# Patient Record
Sex: Female | Born: 1960 | Race: White | Hispanic: No | Smoking: Current every day smoker
Health system: Southern US, Academic
[De-identification: ages and names within clinical notes are randomized; demographics above are authoritative.]

## PROBLEM LIST (undated history)

## (undated) DIAGNOSIS — M1990 Unspecified osteoarthritis, unspecified site: Secondary | ICD-10-CM

## (undated) DIAGNOSIS — T4145XA Adverse effect of unspecified anesthetic, initial encounter: Secondary | ICD-10-CM

## (undated) DIAGNOSIS — R0602 Shortness of breath: Secondary | ICD-10-CM

## (undated) DIAGNOSIS — Q8502 Neurofibromatosis, type 2: Secondary | ICD-10-CM

## (undated) DIAGNOSIS — Q8500 Neurofibromatosis, unspecified: Secondary | ICD-10-CM

## (undated) DIAGNOSIS — R1310 Dysphagia, unspecified: Secondary | ICD-10-CM

## (undated) DIAGNOSIS — F32A Depression, unspecified: Secondary | ICD-10-CM

## (undated) DIAGNOSIS — F329 Major depressive disorder, single episode, unspecified: Secondary | ICD-10-CM

## (undated) DIAGNOSIS — E875 Hyperkalemia: Secondary | ICD-10-CM

## (undated) DIAGNOSIS — K219 Gastro-esophageal reflux disease without esophagitis: Secondary | ICD-10-CM

## (undated) DIAGNOSIS — F17210 Nicotine dependence, cigarettes, uncomplicated: Secondary | ICD-10-CM

## (undated) DIAGNOSIS — E785 Hyperlipidemia, unspecified: Secondary | ICD-10-CM

## (undated) DIAGNOSIS — T8859XA Other complications of anesthesia, initial encounter: Secondary | ICD-10-CM

## (undated) HISTORY — PX: HX EYE SURGERY: 2100001143

---
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
## 2009-08-09 HISTORY — PX: HX GALL BLADDER SURGERY/CHOLE: SHX55

## 2011-07-10 HISTORY — PX: HX HYSTERECTOMY: SHX81

## 2013-03-26 ENCOUNTER — Telehealth (INDEPENDENT_AMBULATORY_CARE_PROVIDER_SITE_OTHER): Payer: Self-pay

## 2013-03-26 ENCOUNTER — Ambulatory Visit
Admission: RE | Admit: 2013-03-26 | Discharge: 2013-03-26 | Disposition: A | Discharge status: Home / Self Care | Payer: MEDICAID | Source: Ambulatory Visit

## 2013-03-26 ENCOUNTER — Ambulatory Visit (INDEPENDENT_AMBULATORY_CARE_PROVIDER_SITE_OTHER): Payer: MEDICAID

## 2013-03-26 ENCOUNTER — Encounter (INDEPENDENT_AMBULATORY_CARE_PROVIDER_SITE_OTHER): Payer: Self-pay

## 2013-03-26 VITALS — BP 112/68 | HR 70 | Temp 97.0°F | Resp 16 | Ht 62.25 in | Wt 110.0 lb

## 2013-03-26 DIAGNOSIS — Z13220 Encounter for screening for lipoid disorders: Secondary | ICD-10-CM | POA: Insufficient documentation

## 2013-03-26 DIAGNOSIS — Z0000 Encounter for general adult medical examination without abnormal findings: Secondary | ICD-10-CM | POA: Insufficient documentation

## 2013-03-26 DIAGNOSIS — R5381 Other malaise: Secondary | ICD-10-CM | POA: Insufficient documentation

## 2013-03-26 DIAGNOSIS — F3289 Other specified depressive episodes: Secondary | ICD-10-CM | POA: Insufficient documentation

## 2013-03-26 DIAGNOSIS — Q8502 Neurofibromatosis, type 2: Secondary | ICD-10-CM | POA: Insufficient documentation

## 2013-03-26 LAB — LIPID PANEL
CHOL/HDL RATIO: 3.4
CHOLESTEROL: 239 mg/dL — ABNORMAL HIGH (ref 120–199)
HDL-CHOLESTEROL: 71 mg/dL — ABNORMAL HIGH (ref 45–65)
LDL (CALCULATED): 143 mg/dL — ABNORMAL HIGH (ref <130)
TRIGLYCERIDES: 125 mg/dL (ref 35–135)
VLDL (CALCULATED): 25 mg/dL

## 2013-03-26 LAB — COMPREHENSIVE METABOLIC PROFILE - BMC/JMC ONLY
ALBUMIN/GLOBULIN RATIO: 1.6
ALBUMIN: 4.4 g/dL (ref 3.5–5.0)
ALKALINE PHOSPHATASE: 88 IU/L (ref 38–126)
ALT (SGPT): 15 IU/L (ref 14–54)
ANION GAP: 6 mmol/L (ref 3–11)
AST (SGOT): 22 IU/L (ref 15–41)
BILIRUBIN, TOTAL: 0.6 mg/dL (ref 0.3–1.2)
BUN: 11 mg/dL (ref 6–20)
CALCIUM: 9.6 mg/dL (ref 8.6–10.3)
CARBON DIOXIDE: 29 mmol/L (ref 22–32)
CHLORIDE: 107 mmol/L (ref 101–111)
CREATININE: 0.62 mg/dL (ref 0.44–1.00)
ESTIMATED GLOMERULAR FILTRATION RATE: >60 mL/min (ref >60)
GLUCOSE: 91 mg/dL (ref 70–110)
POTASSIUM: 4.9 mmol/L (ref 3.4–5.1)
SODIUM: 142 mmol/L (ref 136–145)
TOTAL PROTEIN: 7.1 g/dL (ref 6.4–8.3)

## 2013-03-26 LAB — CBC
BASOPHIL #: 0.1 10*3/uL (ref 0.0–0.10)
BASOPHILS %: 1.6 % (ref 0–2.50)
EOSINOPHIL #: 0.2 10*3/uL (ref 0.00–0.50)
EOSINOPHIL %: 3.1 % (ref 0.0–5.2)
HCT: 45.3 % (ref 36.0–48.0)
HGB: 15.5 g/dL (ref 12.0–16.0)
LYMPHOCYTE #: 1.6 K/uL (ref 0.7–3.20)
LYMPHOCYTE %: 30.8 % (ref 15.0–43.0)
MCH: 32 pg (ref 28.3–34.3)
MCHC: 34.1 g/dL (ref 32.0–36.0)
MCV: 93.9 fL (ref 82.0–100.0)
MONOCYTE #: 0.6 10*3/uL (ref 0.20–0.90)
MONOCYTE %: 10.8 % (ref 4.8–12.0)
MPV: 8.2 fL (ref 7.4–10.45)
NRBC ABSOLUTE: 0 10*3/uL (ref 0–0.02)
NRBC: 0.1 /100 WBC (ref 0–0.3)
PLATELET COUNT: 228 10*3/uL (ref 150–400)
PMN #: 2.8 10*3/uL (ref 1.5–6.5)
PMN %: 53.7 % (ref 43.0–76.0)
RBC: 4.83 M/uL (ref 4.0–5.6)
RDW: 13.5 % (ref 11.0–16.0)
WBC: 5.1 10*3/uL (ref 4.0–11.0)

## 2013-03-26 MED ORDER — CELECOXIB 200 MG CAPSULE
200.00 mg | ORAL_CAPSULE | Freq: Two times a day (BID) | ORAL | Status: DC
Start: 2013-03-26 — End: 2013-03-28

## 2013-03-26 MED ORDER — RANITIDINE 150 MG TABLET
150.00 mg | ORAL_TABLET | Freq: Two times a day (BID) | ORAL | Status: DC
Start: 2013-03-26 — End: 2014-01-12

## 2013-03-26 MED ORDER — ALBUTEROL SULFATE HFA 90 MCG/ACTUATION AEROSOL INHALER
1.0000 | INHALATION_SPRAY | Freq: Four times a day (QID) | RESPIRATORY_TRACT | Status: DC | PRN
Start: 2013-03-26 — End: 2013-08-13

## 2013-03-26 NOTE — H&P (Signed)
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  Mammogram - last 2-3 yrs ago.  Could not afford the prep for a c-scope but did have one ordered.    SURG HX - total hysterectomy (except cervix - which she still has), laparoscopic GB, left blepharoplasty for ptosis, left ureter surgery age 52.    SOC HX - smoker 1ppd (started age 33), occasional EtOH,  Works at [employer], lives in [place] now, 3 children (son lives in area, other 2 are in [place]). Separated from spouse.  At least 1 pot of coffee per day (more in winter), cocacola 20-40oz per day (had been drinking 12 pack per day), drinking 2-3 glasses of water (and lots of vitamin water).     FAM HX - Neurofibromatosis - told father's great aunt also had it (no 1st degree relatives), mAunt with b/l breast CA, brother with Parkinson's (around age 83), sister with hypothyroidism, sister with polio, father with esophageal CA, father with MI (late 19's), mother with severe HTN.    GEN - daytime fatigue (pt snores), hot flashes & night sweats. Otherwise  HEENT - some sinus congestion, + chronic HOH, chronic vision issues, otherwise negative  CV - negative  LUNGS - + DOE/SOB, smokers cough (no hemoptysis), no wheezing  GI - + reflux occ worse with fried foods, otherwise negative  GYN - post-menopausal, otherwise negative   PSYCH - depression (sometimes just wants to cry for nothing, and emotionally labile with anger), does feel worse since she has been off Paxil.  NEURO - negative seizures or syncope, occ feet numbing with legs crossed.  SKIN - itching right anterior shin, multiple nodules from neurofibromatosis  HEME - easy bruising for past 1 yr, no problems with bleeding really.    O: VSS. AF.   GEN- A&O, in NAD   HEENT - NCAT. TMs benign b/l. Sclera nonicteric. PERRLA. EOMI. Nares benign b/l. OP benign. No neck masses or lymphadenopathy.   CV - RRR, s1 and s2, no murmurs   LUNGS - CTAB no w/r/r   ABD - +BSx4, NTTP, no palpable organomegaly.   MSK- ROM intact, no gross abnormalities.   NEURO - CN2-12 intact b/l, Patellar DTR +2 b/l.   BREAST - deferred until next visit  PELVIC - deferred until next visit  PSYCH -appropriate mood and affect, thoughts logical and goal oriented.  SKIN- soft, fleshy nodules noted covering entire body, ranging in size from 5mm to 3cm.  What appears to be lipoma underlying one nodule right upper back.    A/P: NEUROFIBROMATOSIS - Referral to neurology. C-scope as below.  COPD - Albuterol prn.  FATIGUE - CBC, TSH, referral for sleep study  GERD - Zantac  DEPRESSION- Paxil, Vit D level  OA - Celebrex not covered by [facility]. Will Rx Meloxicam 7.5 / d instead.   HM - Referral for c-scope, mammo ordered. F/u in 2 weeks  for pelvic and breast exam.

## 2013-03-26 NOTE — Progress Notes (Signed)
BP 112/68   Pulse 70   Temp(Src) 36.1 C (97 F) (Oral)   Resp 16   Ht 1.581 m (5' 2.25")   Wt 49.896 kg (110 lb)   BMI 19.96 kg/m2

## 2013-03-26 NOTE — Patient Instructions (Signed)
-   Have labs drawn today.  - Referral for Mammogram - call [phone] to schedule [facility]).  - Referral for Neurology -   - Referral for Colonoscopy -   - Sleep Study ordered  - Restart Paxil 20 mg daily.  - Celebrex for arthritis  - Zantac twice daily for reflux  - Follow up with Dr. Berna Spare in 2-4 weeks - for lab results review and breast & pelvic exam.

## 2013-03-26 NOTE — Telephone Encounter (Signed)
Celebrex not preferred with medicaid. Please change to preferred alternative.  meloxicam

## 2013-03-28 LAB — VITAMIN D, SERUM (25 HYDROXYVITAMIN D2 AND D3 BY MS)
25 HYDROXYVITAMIN D2/D3,total: 32.5 ng/mL
25 HYDROXYVITAMIN D3: 32.5 ng/mL

## 2013-03-28 MED ORDER — MELOXICAM 7.5 MG TABLET
7.5000 mg | ORAL_TABLET | Freq: Every day | ORAL | Status: DC
Start: 2013-03-28 — End: 2013-06-25

## 2013-04-01 ENCOUNTER — Ambulatory Visit
Admission: RE | Admit: 2013-04-01 | Discharge: 2013-04-01 | Disposition: A | Discharge status: Home / Self Care | Payer: MEDICAID | Source: Ambulatory Visit

## 2013-04-01 DIAGNOSIS — R5381 Other malaise: Secondary | ICD-10-CM | POA: Insufficient documentation

## 2013-04-02 ENCOUNTER — Encounter (INDEPENDENT_AMBULATORY_CARE_PROVIDER_SITE_OTHER): Payer: Self-pay | Admitting: Surgery

## 2013-04-02 ENCOUNTER — Ambulatory Visit (INDEPENDENT_AMBULATORY_CARE_PROVIDER_SITE_OTHER): Payer: MEDICAID | Admitting: Surgery

## 2013-04-02 VITALS — BP 127/68 | HR 70 | Temp 96.8°F | Ht 62.25 in | Wt 106.8 lb

## 2013-04-02 NOTE — Progress Notes (Signed)
Medicaid does not require authorization for colonoscopy scheduled for 04/18/13.

## 2013-04-02 NOTE — Progress Notes (Signed)
See dictated note.    #[id number]

## 2013-04-03 NOTE — H&P (Signed)
 [facility]                                       HISTORY AND PHYSICAL    PATIENT NAME: [name], [name]  HOSPITAL NUMBER:[id number]  DATE OF SERVICE:04/02/2013  DATE OF BIRTH: 08-Jun-1961    The patient is being seen in consultation at the request of Dr. Arch for colon cancer screening.    CHIEF COMPLAINT:  Colon cancer screening.    HISTORY OF PRESENT ILLNESS:  The patient is a 52 year old female with a history of neurofibromatosis who presents for initial colon cancer screening.  She is unaware of any family history of colon cancer.  She further denies any abdominal pain, blood per rectum, change in bowel habits or unexplained weight loss.  The patient has had no prior colonoscopy.    ALLERGIES:  CODEINE.    MEDICATIONS:  1.  Proventil  inhaler 1-2 puffs every 6 hours as needed.  2.  Mobic  7.5 milligrams daily.  3.  Paxil  20 milligrams every morning.  4.  Zantac  150 milligrams twice a day.    PAST MEDICAL HISTORY:  Gastroesophageal reflux disease, neurofibromatosis, hiatal hernia, chronic obstructive pulmonary disease and arthritis.    PAST SURGICAL HISTORY:  Laparoscopic cholecystectomy in 2011, hysterectomy with cervical exclusion in 2013, upper endoscopy.      SOCIAL HISTORY:  Smokes cigarettes, 1 pack a day.  She states she is trying to stop.  She denies alcohol use.    FAMILY HISTORY:  Esophageal cancer, diabetes and hypertension.    REVIEW OF SYSTEMS   Constitutional:  Weight loss.    HEENT:  Headaches, wheezing.  Eyes:  Negative.  Cardiovascular:  Difficulty breathing while sleeping.    Respiratory:  Chronic cough, shortness of breath, wheezing.    Gastrointestinal:  Heartburn.  Genitourinary:  Negative.    Musculoskeletal:  Neck pain, back pain, joint pain.    Hematologic:  Negative.  Immunologic:  Negative.  Lymphatic:  Negative.    Breasts:  Negative.    Endocrine:  Negative.   Psychiatric:  Depression and anxiety.    PHYSICAL EXAMINATION:  Vital signs show  blood pressure 127/68, pulse 80, temperature 96.8 degrees Fahrenheit, weight 106.2 pounds, height 62.25 inches.  The patient is alert and oriented, in no acute distress, a well-developed, well-hydrated female who has multiple, soft, pedunculated tumors of various sizes throughout her body, extremities, face.   HEENT:  Head is atraumatic.  Sclerae are anicteric.  Mouth is moist, slight erythema in the upper palate at the interface of her dentures.  NECK:  Supple, no nodes.  Trachea is midline.  LUNGS:  Bilaterally clear to auscultation.  BACK:  No costovertebral angle tenderness.  HEART:  S1 and S2, regular rate and rhythm.  ABDOMEN:  Nondistended, soft, normoactive bowel sounds.  Epigastric tenderness.    EXTREMITIES:  The patient moves all 4 extremities.  Radial pulses are 3+.  Grip strength 5/5.  No lower extremity edema.  NEUROLOGIC:  No focal neurological deficits.  Cranial nerves grossly intact.    ASSESSMENT:  A 52 year old female with neurofibromatosis, for colon cancer screening.    PLAN:  Colonoscopy.    The risks of the procedure were discussed.  Discussion was kept in layman's terms.  The patient's questions were answered and she voiced understanding and agreed to proceed.  Preop workup will otherwise be routine.  She will undergo Dulcolax, MiraLax, Gatorade  bowel prep.      Darryle Lesches, MD      TY/xf/[id number]; D: 04/02/2013 13:49:04 T: 04/02/2013 14:24:10

## 2013-04-10 NOTE — Procedures (Signed)
[facility]                                 [place], [place] [address]                                         SLEEP STUDY      PATIENT NAME:   [name], [name]   MEDICAL RECORD [id number]  DATE OF BIRTH:  [birth year]    SERVICE DATE:   04/01/2013         PRIMARY CARE PHYSICIAN:  Dr. Gwenith Daily.    INDICATIONS FOR TESTING:  Fatigue, witnessed snoring, difficulty initiating and maintaining sleep.    DESCRIPTION OF PROCEDURE:  The patient is a 52 year old lady studied at the sleep disorder and diagnostic center in [place], [place], on April 01, 2013, for 415 minutes.  She had sleep onset of 12 1/2 minutes and a sleep efficiency of 90%.  Sleep architecture reveals 3.5 minutes in stage 1, 291.5 minutes in stage 2, 55 minutes in stage 3,16.5 minutes in REM, and 49 minutes awake.  The patient had rare episodes of sleep-disordered breathing over the course of the night, 13 episodes of hypopnea with an apnea-hypopnea index equal to 3.  The patient had brief oxygen desaturation as low as 86%.  There was 3 1/2 minutes with saturations below 89 total.  The patient had frequent periodic leg movements.  The leg movement index was 35.  58 of the leg movements resulted in arousals with an arousal index of 9.5.    IMPRESSION:  1.  No evidence of sleep-disordered breathing.  2.  Periodic leg movements of sleep.    RECOMMENDATION:  CPAP is not indicated at this time.      Cristal Generous, MD      HQ/IO/[id number]; D: 04/10/2013 11:54:29; T: 04/10/2013 13:23:32    cc: Milda Smart DO      [address]       [place], [place] [address]

## 2013-04-13 ENCOUNTER — Encounter (INDEPENDENT_AMBULATORY_CARE_PROVIDER_SITE_OTHER): Payer: Self-pay

## 2013-04-13 ENCOUNTER — Ambulatory Visit
Admission: RE | Admit: 2013-04-13 | Discharge: 2013-04-13 | Disposition: A | Discharge status: Home / Self Care | Payer: MEDICAID | Source: Ambulatory Visit | Attending: Surgery | Admitting: Surgery

## 2013-04-13 HISTORY — DX: Depression, unspecified: F32.A

## 2013-04-13 HISTORY — DX: Other complications of anesthesia, initial encounter: T88.59XA

## 2013-04-13 HISTORY — DX: Shortness of breath: R06.02

## 2013-04-13 NOTE — Nurses Notes (Signed)
Preoperative telephone interview completed at this time. Patient verbalizes understanding of preoperative instructions. Day of surgery instructions given. All questions answered. Patient instructed to take Inhaler the morning of surgery.

## 2013-04-13 NOTE — Progress Notes (Signed)
Sleep Study results scanned.    Recommendation:  CPAP is not indicated at this time.

## 2013-04-18 ENCOUNTER — Inpatient Hospital Stay
Admission: RE | Admit: 2013-04-18 | Discharge: 2013-04-18 | Disposition: A | Discharge status: Home / Self Care | Payer: MEDICAID | Source: Ambulatory Visit | Attending: Surgery | Admitting: Surgery

## 2013-04-18 ENCOUNTER — Encounter
Admission: RE | Disposition: A | Discharge status: Home / Self Care | Payer: Self-pay | Source: Ambulatory Visit | Attending: Surgery

## 2013-04-18 DIAGNOSIS — Z800 Family history of malignant neoplasm of digestive organs: Secondary | ICD-10-CM | POA: Insufficient documentation

## 2013-04-18 DIAGNOSIS — Z833 Family history of diabetes mellitus: Secondary | ICD-10-CM | POA: Insufficient documentation

## 2013-04-18 DIAGNOSIS — Q8500 Neurofibromatosis, unspecified: Secondary | ICD-10-CM | POA: Insufficient documentation

## 2013-04-18 DIAGNOSIS — K219 Gastro-esophageal reflux disease without esophagitis: Secondary | ICD-10-CM | POA: Insufficient documentation

## 2013-04-18 DIAGNOSIS — J4489 Other specified chronic obstructive pulmonary disease: Secondary | ICD-10-CM | POA: Insufficient documentation

## 2013-04-18 DIAGNOSIS — M129 Arthropathy, unspecified: Secondary | ICD-10-CM | POA: Insufficient documentation

## 2013-04-18 DIAGNOSIS — Z8249 Family history of ischemic heart disease and other diseases of the circulatory system: Secondary | ICD-10-CM | POA: Insufficient documentation

## 2013-04-18 DIAGNOSIS — Z1211 Encounter for screening for malignant neoplasm of colon: Secondary | ICD-10-CM | POA: Insufficient documentation

## 2013-04-18 DIAGNOSIS — F17200 Nicotine dependence, unspecified, uncomplicated: Secondary | ICD-10-CM | POA: Insufficient documentation

## 2013-04-18 DIAGNOSIS — K449 Diaphragmatic hernia without obstruction or gangrene: Secondary | ICD-10-CM | POA: Insufficient documentation

## 2013-04-18 DIAGNOSIS — F3289 Other specified depressive episodes: Secondary | ICD-10-CM | POA: Insufficient documentation

## 2013-04-18 HISTORY — PX: PB COLONOSCOPY,DIAGNOSTIC: 45378

## 2013-04-18 HISTORY — DX: Neurofibromatosis, unspecified (CMS HCC): Q85.00

## 2013-04-18 SURGERY — COLONOSCOPY
Anesthesia: General | Site: Rectum | Wound class: Clean Contaminated Wounds-The respiratory, GI, Genital, or urinary

## 2013-04-18 MED ORDER — LIDOCAINE HCL 2 % MUCOSAL JELLY
5.0000 mL | Freq: Once | Status: AC
Start: 2013-04-18 — End: 2013-04-18
  Administered 2013-04-18: 5 mL via TOPICAL

## 2013-04-18 SURGICAL SUPPLY — 1 items: GOWN SURG XL XLNG L4 RGLN SLEEVE BRTHBL A LINE STRL LF  DISP SMARTGOWN (DGOW) ×1 IMPLANT

## 2013-04-18 NOTE — Consults (Signed)
I certify that I was present for induction, emergence and critical phases of this case and that I was at all times immediately available for consultation and management as necessary. Care was provided by credentialed providers and I reviewed and concurred with the anesthesia plan.

## 2013-04-18 NOTE — Consults (Signed)
[facility]  ANESTHESIA POSTOP EVALUATION NOTE    Alexa, Simon, 52 y.o. female  Date of Birth:  August 01, 1961    04/18/2013    Temperature: 36.4 C (97.5 F) (04/18/13 1215)  Heart Rate: 86 (04/18/13 1215)  BP (Non-Invasive): 121/86 mmHg (04/18/13 1215)  Respiratory Rate: 15 (04/18/13 1215)  SpO2-1: 96 % (04/18/13 1215)  Pain Score (Numeric, Faces): 0 (04/18/13 1215)    I have reviewed and evaluated the following:  Respiratory Function: Regular and unlabored breathing, consistent with pre-anesthetic level  Cardiovascular Function:  Vital signs are stable, consistent with pre anesthetic level  Mental Status: Awake and alert  Pain:  None  Nausea and Vomiting:  None      Post operative complications: None          Burna Forts, MD 04/18/2013, 12:44 PM

## 2013-04-18 NOTE — OR Surgeon (Signed)
[facility]                                           BRIEF OPERATIVE NOTE    Patient Name: [facility] Number: [id number]  Date of Service: 04/18/2013   Date of Birth: September 18, 1960    Chief Complaint: Colon cancer screening    Pre-Operative Diagnosis: Colon cancer screening [V76.51]    Post-Operative Diagnosis: normal colon    Procedure(s)/Description:  Procedure(s):  COLONOSCOPY    Findings: Normal colon    Attending Surgeon: Lowella Dandy, MD    Anesthesia:  CRNA: Roseanne Kaufman, CRNA    Estimated Blood Loss:  Minimal    Specimens Removed:  None                Complications:   None    Patient is at increased risk for surgical bleeding:  No    Patient is on postop antibiotic regimen greater than 24 hours:  No           Disposition: PACU - hemodynamically stable.       Lowella Dandy, MD 04/18/2013 12:04 PM

## 2013-04-18 NOTE — OR Surgeon (Signed)
[facility]                                  [place], [place] [address]                                                                OPERATIVE SUMMARY    PATIENT NAME: [name], [name]  HOSPITAL NUMBER:[id number]  DATE OF SERVICE:04/18/2013  DATE OF BIRTH: 12/13/1960    PREOPERATIVE DIAGNOSIS:  Colon cancer screening.    POSTOPERATIVE DIAGNOSIS:  Normal colon.    NAME OF PROCEDURE:  Colonoscopy.    SURGEON:  Dr. Westly Pam    ANESTHESIA:  Monitored anesthesia care.    INDICATIONS FOR PROCEDURE:  The patient is a 52 year old female who presents for colon cancer screening.    FINDINGS:  Grossly normal-appearing colonic mucosa.    TECHNICAL DESCRIPTION OF PROCEDURE:  The patient was brought to the endoscopy suite, placed in the left lateral decubitus position.  After initiation of IV sedation and completion of a timeout, a digital rectal exam was performed.  Consultation was inserted and with a moderate degree of difficulty, it was able to be advance to the cecum.  This required application of abdominal wall pressure.  Cecal position was confirmed with visualization of the ileocecal valve and the appendiceal orifice.  Further evaluation of the colon was performed and the scope was retrieved.  At 10-cm scope was retroflexed for J-view.  The scope was then straightened.  Air and fluid were suctioned from the colon and the scope was removed.  The patient tolerated the procedure well.  There were no complications.    ESTIMATED BLOOD LOSS:  None.    FLUIDS:  As per anesthesia records.    SPECIMENS:  None.    The patient was transferred awake and hemodynamically stable to the recovery room area.  All counts were correct.      Westly Pam, MD      UE/AV/[id number]; D: 04/18/2013 12:42:58; T: 04/18/2013 13:06:29

## 2013-04-18 NOTE — Consults (Signed)
 [facility]  ANESTHESIA PRE-OP EVALUATION    MRN:  [id number]  [name]  52 y.o.  Sex: female     Date of Service: 04/18/2013    Surgeon: Clotilde):  Arloa Darryle CROME, MD    Scheduled Procedure:  Procedure(s):  COLONOSCOPY    Diagnosis/Pertinent HPI: Colon cancer screening [V76.51]     Weight: 48.081 kg (106 lb)  Height: 157.5 cm (5' 2.01)  BP (Non-Invasive): 129/75 mmHg  Heart Rate: 73  Respiratory Rate: 16  Temperature: 36.5 C (97.7 F)  SpO2-1: 99 %    ALLERGY:    Allergies   Allergen Reactions   . Codeine Hives/ Urticaria       Medications Prior to Admission    Outpatient Medications    albuterol  sulfate (PROVENTIL  OR VENTOLIN ) 90 mcg/actuation Inhalation HFA Aerosol Inhaler    Take 1-2 Puffs by inhalation Every 6 hours as needed    cholecalciferol, vitamin D3, 1,000 unit Oral Tablet    Take 1,000 Units by mouth Once a day    meloxicam  (MOBIC ) 7.5 mg Oral Tablet    Take 1 Tab (7.5 mg total) by mouth Once a day    PARoxetine  (PAXIL ) 20 mg Oral Tablet    Take 1 Tab (20 mg total) by mouth Every morning    ranitidine  (ZANTAC ) 150 mg Oral Tablet    Take 1 Tab (150 mg total) by mouth Twice daily          Current Facility-Administered Medications:  LR premix infusion  Intravenous Continuous   NS flush syringe 2.5 mL Intravenous Q15 Min PRN       Past Medical History   Diagnosis Date   . Chronic obstructive pulmonary disease    . Anesthesia complication      O2 level dropped   . Shortness of breath    . Depression    . Neurofibromatosis        Pregnant: N/A Hysterectomy    Past Surgical History   Procedure Laterality Date   . Hx gall bladder surgery/chole  2011   . Hx hysterectomy  07/2011   . Hx other  1971     replaced ureter to left kidney   . Hx eye surgery       Eye lid reduction       Prior Anesthesia Difficulties:  no    Familial Anesthesia Difficulties: no    History   Substance Use Topics   . Smoking status: Current Every Day Smoker -- 1.00 packs/day for 35 years    Types: Cigarettes   . Smokeless tobacco: Never Used   . Alcohol Use: No       Labs:   BMP:    Lab Results   Component Value Date    SODIUM 142 03/26/2013    POTASSIUM 4.9 03/26/2013    CHLORIDE 107 03/26/2013    CO2 29 03/26/2013    BUN 11 03/26/2013    CREATININE 0.62 03/26/2013    GLUCOSECJ 91 03/26/2013    ANIONGAP 6 03/26/2013    GFR >60 03/26/2013    CALCIUM 9.6 03/26/2013     CBC:    Lab Results   Component Value Date    WBCJ 5.1 03/26/2013    HGB 15.5 03/26/2013    HCT 45.3 03/26/2013      Platelets:    Lab Results   Component Value Date    PLTCNT 228 03/26/2013     Coags:    No results found  for this basename: prothromtme, inr, aptt, ddimerquant, fibrinogen     TSH:    No results found for this basename: TSH          EKG: None    CXR:  None    Sleep study negative      ANESTHESIA DAY OF SURGERY EVALUATION      NPO: > 8 hrs  Airway: II (soft palate, uvula, fauces visible)  Dentition:  upper and lower dentures    Cardiovascular: regular rate and rhythm   Respiratory: Clear to auscultation bilaterally.        ASA Status: 2  Proposed Anesthesia: IV General.Used inhalers today      Pre-Induction Evaluation and informed consent including risks, benefits, and alternatives. Patient was seen and evaluated immediately prior to the induction of anesthesia.        Rosan KATHEE Hood, MD 04/18/2013, 11:11 AM

## 2013-04-18 NOTE — H&P (Signed)
 [facility]                                                                                       H&P Update Form    [name], [name], 52 y.o. female  Date of Admission:  04/18/2013  Date of Birth:  1960/11/25    04/18/2013    STOP: IF H&P IS GREATER THAN 30 DAYS FROM SURGICAL DAY COMPLETE NEW H&P IS REQUIRED.    Outpatient Pre-Surgical H & P updated the day of the procedure.  1.  H&P completed within 30 days of surgical procedure was performed by Dr. Arloa on 04/02/2013 and has been reviewed, the patient has been examined, and no change has occured in the patients condition since the H&P was completed.       Change in medications: No      Last Menstrual Period: s/p hysterectomy      Comments: A 52 year old female with neurofibromatosis, for colon cancer screening.      2.  Patient continues to be appropiate candidate for planned surgical procedure. YES      Darryle LITTIE Arloa, MD

## 2013-04-18 NOTE — Nurses Notes (Signed)
Patient discharged home with family.  AVS reviewed with patient/care giver.  A written copy of the AVS and discharge instructions was given to the patient/care giver.  Questions sufficiently answered as needed.  Patient/care giver encouraged to follow up with PCP as indicated.  In the event of an emergency, patient/care giver instructed to call 911 or go to the nearest emergency room.

## 2013-04-23 ENCOUNTER — Encounter (INDEPENDENT_AMBULATORY_CARE_PROVIDER_SITE_OTHER): Payer: MEDICAID

## 2013-04-23 ENCOUNTER — Ambulatory Visit
Admission: RE | Admit: 2013-04-23 | Discharge: 2013-04-23 | Disposition: A | Discharge status: Home / Self Care | Payer: MEDICAID | Source: Ambulatory Visit

## 2013-04-23 DIAGNOSIS — Z1231 Encounter for screening mammogram for malignant neoplasm of breast: Secondary | ICD-10-CM | POA: Insufficient documentation

## 2013-05-02 ENCOUNTER — Other Ambulatory Visit
Admission: RE | Admit: 2013-05-02 | Discharge: 2013-05-02 | Disposition: A | Discharge status: Home / Self Care | Payer: MEDICAID

## 2013-05-02 ENCOUNTER — Ambulatory Visit (INDEPENDENT_AMBULATORY_CARE_PROVIDER_SITE_OTHER): Payer: MEDICAID

## 2013-05-02 ENCOUNTER — Other Ambulatory Visit: Payer: Self-pay

## 2013-05-02 ENCOUNTER — Encounter (INDEPENDENT_AMBULATORY_CARE_PROVIDER_SITE_OTHER): Payer: Self-pay

## 2013-05-02 VITALS — BP 102/58 | HR 62 | Temp 97.4°F | Resp 14 | Ht 62.0 in | Wt 104.0 lb

## 2013-05-02 DIAGNOSIS — Z124 Encounter for screening for malignant neoplasm of cervix: Secondary | ICD-10-CM | POA: Insufficient documentation

## 2013-05-02 DIAGNOSIS — Z1151 Encounter for screening for human papillomavirus (HPV): Secondary | ICD-10-CM | POA: Insufficient documentation
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
## 2013-05-02 NOTE — Patient Instructions (Addendum)
 [place]  [facility]  [phone]  [email or web address]    Advance Directive and Other Forms Downloadable for Personal Use from the website:  [facility] e-Directive Registry signup form   FAQ: Health Care Decision Making in   (includes forms)   [place] Living Will Form   [place] Living Will Form Insurance risk surveyor PDF)   [place] Medical Power of Attorney Form   [facility] Medical Power of Attorney Form (editable PDF)   [place] [facility] Living Will and Medical Power of Attorney Form   [place]  [facility] Living Will and Medical Power of Attorney Form (editable PDF)   [place] Checklist for Surrogate Selection   Hospice Services in a Nursing Home Facility Brochure Restless Legs Syndrome  Restless legs syndrome is a movement disorder. It may also be called a sensori-motor disorder.   CAUSES   No one knows what specifically causes restless legs syndrome, but it tends to run in families. It is also more common in people with low iron, in pregnancy, in people who need dialysis, and those with nerve damage (neuropathy).Some medications may make restless legs syndrome worse.Those medications include drugs to treat high blood pressure, some heart conditions, nausea, colds, allergies, and depression.  SYMPTOMS  Symptoms include uncomfortable sensations in the legs. These leg sensations are worse during periods of inactivity or rest. They are also worse while sitting or lying down. Individuals that have the disorder describe sensations in the legs that feel like:   Pulling.   Drawing.   Crawling.   Worming.   Boring.   Tingling.   Pins and needles.   Prickling.   Pain.  The sensations are usually accompanied by an overwhelming urge to move the legs. Sudden muscle jerks may also occur. Movement provides temporary relief from the discomfort. In rare cases, the arms may also be affected. Symptoms may interfere with going to sleep (sleep onset insomnia). Restless legs syndrome may also be related to periodic limb movement disorder  (PLMD). PLMD is another more common motor disorder. It also causes interrupted sleep. The symptoms from PLMD usually occur most often when you are awake.  TREATMENT   Treatment for restless legs syndrome is symptomatic. This means that the symptoms are treated.    Massage and cold compresses may provide temporary relief.   Walk, stretch, or take a cold or hot bath.   Get regular exercise and a good night's sleep.   Avoid caffeine, alcohol, nicotine , and medications that can make it worse.   Do activities that provide mental stimulation like discussions, needlework, and video games. These may be helpful if you are not able to walk or stretch.  Some medications are effective in relieving the symptoms. However, many of these medications have side effects. Ask your caregiver about medications that may help your symptoms. Correcting iron deficiency may improve symptoms for some patients.  Document Released: 07/16/2002 Document Revised: 10/18/2011 Document Reviewed: 10/22/2010  [facility] Patient Information 2014 [place], [place].      SCALENE (NECK) MUSCLE TENSION            The scalene muscles connect from the upper and middle neck bones to the ribs.  When they contract, they move the head from side to side (ear towards shoulder). They also help to stabilize the neck from jarring or whiplash.   The scalenes can easily get strained from chronically holding the shoulders up too high (like often happens with typing on a computer or gripping a steering wheel while driving for long periods of time)  or with chronic stress/anxiety and poor posture.    The brachial plexus contains all of the nerves that travel to the arm and hand - it passes just between the scalene muscles.  So spasm of the scalenes can lead to nerve pain in the arm as the nerves get pinched between the muscles.    TREATMENT   Scalene muscle tension is best treated with osteopathic manipulation, heat (such as a rice sock applied for 20 min several times  per day) and neck stretches performed at least twice daily.   Also work on improving your posture - sitting up straight and keeping your head balanced on top of your spine. Focus on relaxing your shoulders and neck, even when you are stressed.   Avoid carrying heavy bags only on one shoulder which can stress one side of your neck more than the other.   Sometimes muscle relaxant medications are prescribed, and tylenol or ibuprofen can be helpful for relieving acute pain.  The best and most effective treatment is regular stretching though.           1) Ear towards shoulder  2) Ear towards breast pocket  3) Eyes towards breast pocket    Hold each for 30-60 seconds.  Perform all three on both sides.  Add weight of opposite hand on top of head for additional stretch.

## 2013-05-04 LAB — HISTORICAL CYTOPATHOLOGY-GYN (PAP AND HPV TESTS): Final Diagnosis: NEGATIVE

## 2013-05-07 ENCOUNTER — Other Ambulatory Visit (HOSPITAL_BASED_OUTPATIENT_CLINIC_OR_DEPARTMENT_OTHER): Payer: Self-pay | Admitting: Neurology

## 2013-05-07 LAB — HPV HIGH RISK REFLEX TO GENOTYPE - BMC/JMC ONLY: HPV, RNA: NEGATIVE

## 2013-05-11 ENCOUNTER — Ambulatory Visit (HOSPITAL_BASED_OUTPATIENT_CLINIC_OR_DEPARTMENT_OTHER)
Admission: RE | Admit: 2013-05-11 | Discharge: 2013-05-11 | Disposition: A | Discharge status: Home / Self Care | Payer: MEDICAID | Source: Ambulatory Visit | Attending: Neurology | Admitting: Neurology

## 2013-05-11 DIAGNOSIS — Q8500 Neurofibromatosis, unspecified: Secondary | ICD-10-CM | POA: Insufficient documentation

## 2013-05-11 MED ADMIN — gadobutroL 7.5 mmol/7.5 mL (1 mmol/mL) intravenous solution: 5 mL | INTRAVENOUS | NDC 50419032511

## 2013-05-22 ENCOUNTER — Encounter (INDEPENDENT_AMBULATORY_CARE_PROVIDER_SITE_OTHER): Payer: Self-pay

## 2013-06-07 ENCOUNTER — Telehealth (INDEPENDENT_AMBULATORY_CARE_PROVIDER_SITE_OTHER): Payer: Self-pay

## 2013-06-07 NOTE — Telephone Encounter (Signed)
Pt called concerning the Mobic, she states she is not feeling relief and is asking for a different medication. She said she gave it 3 months to make sure it was in her system and it just isn't helping. Please advise.    [facility]  Registration Specialist

## 2013-06-18 NOTE — Telephone Encounter (Signed)
Pt called checking status.

## 2013-06-25 ENCOUNTER — Other Ambulatory Visit (INDEPENDENT_AMBULATORY_CARE_PROVIDER_SITE_OTHER): Payer: Self-pay

## 2013-06-25 MED ORDER — MELOXICAM 15 MG TABLET
15.0000 mg | ORAL_TABLET | Freq: Every day | ORAL | Status: DC
Start: 2013-06-25 — End: 2013-07-13

## 2013-06-25 NOTE — Telephone Encounter (Signed)
Patient will try 15 mg daily.  Appointment scheduled for 12/10 for follow up.  Refill entered.  Please authorize.

## 2013-06-25 NOTE — Telephone Encounter (Signed)
 Left message for patient to return call.  Per Dr. Arch, patient may take two (2) Mobic  daily (total of 15 mg daily).   RTC of 15 mg is not providing relief.

## 2013-07-13 ENCOUNTER — Other Ambulatory Visit (INDEPENDENT_AMBULATORY_CARE_PROVIDER_SITE_OTHER): Payer: Self-pay

## 2013-07-18 ENCOUNTER — Ambulatory Visit (INDEPENDENT_AMBULATORY_CARE_PROVIDER_SITE_OTHER): Payer: MEDICAID

## 2013-07-18 ENCOUNTER — Encounter (INDEPENDENT_AMBULATORY_CARE_PROVIDER_SITE_OTHER): Payer: Self-pay

## 2013-07-18 VITALS — BP 114/70 | HR 72 | Temp 97.0°F | Resp 16 | Ht 62.0 in | Wt 106.0 lb

## 2013-07-18 MED ORDER — CYCLOBENZAPRINE 5 MG TABLET
5.00 mg | ORAL_TABLET | Freq: Three times a day (TID) | ORAL | Status: DC | PRN
Start: 2013-07-18 — End: 2013-09-27

## 2013-07-18 NOTE — Progress Notes (Signed)
 BP 114/70  Pulse 72  Temp(Src) 36.1 C (97 F) (Oral)  Resp 16  Ht 1.575 m (5' 2)  Wt 48.081 kg (106 lb)  BMI 19.38 kg/m2  Berthel Bagnall, MA 07/18/2013, 8:16 AM

## 2013-07-18 NOTE — Patient Instructions (Signed)
Tension Headache    A tension headache is a feeling of pain, pressure, or aching often felt over the front and sides of the head. The pain can be dull or can feel tight (constricting). It is the most common type of headache. Tension headaches are not normally associated with nausea or vomiting and do not get worse with physical activity. Tension headaches can last 30 minutes to several days.   CAUSES   Most often the cause is related to muscle tension and spasm in the neck and base of head.  Usually it is the trapezius (neck and shoulder muscle) or the sternocleidomastoid (neck turning and sidebending - attaches to the base of the ear) muscle that spasms. Tension headaches often begin after stress, anxiety, or depression. Other triggers may include:  · Carrying heavy bags on the shoulder.  · Holding the phone between your ear and shoulder.  · Driving a lot with poor posture  · Alcohol.  · Caffeine (too much or withdrawal).  · Respiratory infections (colds, flu, sinus infections).  · Dental problems or teeth clenching.  · Fatigue.  · Holding your head and neck in one position too long while using a computer.  SYMPTOMS   · Pressure around the head.    · Dull, aching head pain.    · Pain felt over the front and sides of the head.    · Tenderness in the muscles of the head, neck, and shoulders.  DIAGNOSIS   A tension headache is often diagnosed based on:   · Symptoms.    · Physical examination.    · A CT scan or MRI of your head. These tests may be ordered if symptoms are severe or unusual.  TREATMENT   · Flexeril is a muscle relaxant medication that can help once a headache has started.  · Ibuprofen and tylenol (up to 3,000mg of each every 24' hours) as needed is good for pain control.  · Neck stretches performed for at least 30 seconds on each side in each position twice daily can help prevent these headaches.  · Moist heat from a hot shower, or a rice sock (filled with dry rice and heated in the microwave for 60-90  seconds) placed over the neck and shoulders usually helps.  · Osteopathic Manipulation - there are doctors here at [place] who can treat your muscles, bones and joints to try to remove the muscle spasm and prevent the headaches for the long term.  Just ask the receptionist to schedule an appointment.  HOME CARE INSTRUCTIONS   · Only take over-the-counter or prescription medicines for pain or discomfort as directed by your caregiver.    · Lie down in a dark, quiet room when you have a headache.    · Try massage or other relaxation techniques.    · Heat applied to the head and neck can be used. Use these 3 to 4 times per day for 15 to 20 minutes each time, or as needed.    · Limit stress.    · Sit up straight, and do not tense your muscles.    · Make sure you computer screen is at eye level and your keyboard is at the same height as your elbows.  · Don't carry heavy bags on one shoulder only (use a backpack).  · Quit smoking if you smoke and limit alcohol use.  · Decrease the amount of caffeine you drink, or stop drinking caffeine.  · Eat and exercise regularly.  · Get 7 to 9 hours of sleep, or as   recommended by your doctor.   Drink plenty of water - at least 1/2 gallon daily.   Avoid excessive use of pain medicine as recurrent headaches can occur.   SEEK MEDICAL CARE IF:    You have problems with the medicines you were prescribed.   Your medicines do not work.   You have a change from the usual headache.   You have nausea or vomiting.  SEEK IMMEDIATE MEDICAL CARE IF:    Your headache becomes severe.   You have a fever.   You have a stiff neck.   You have loss of vision.   You have muscular weakness or loss of muscle control.   You lose your balance or have trouble walking.   You feel faint or pass out.   You have severe symptoms that are different from your first symptoms.    SCALENE (NECK) MUSCLE TENSION            The scalene muscles connect from the upper and middle neck bones to the ribs.   When they contract, they move the head from side to side (ear towards shoulder). They also help to stabilize the neck from jarring or whiplash.   The scalenes can easily get strained from chronically holding the shoulders up too high (like often happens with typing on a computer or gripping a steering wheel while driving for long periods of time) or with chronic stress/anxiety and poor posture.    The brachial plexus contains all of the nerves that travel to the arm and hand - it passes just between the scalene muscles.  So spasm of the scalenes can lead to nerve pain in the arm as the nerves get pinched between the muscles.    TREATMENT   Scalene muscle tension is best treated with osteopathic manipulation, heat (such as a rice sock applied for 20 min several times per day) and neck stretches performed at least twice daily.   Also work on improving your posture - sitting up straight and keeping your head balanced on top of your spine. Focus on relaxing your shoulders and neck, even when you are stressed.   Avoid carrying heavy bags only on one shoulder which can stress one side of your neck more than the other.   Sometimes muscle relaxant medications are prescribed, and tylenol or ibuprofen can be helpful for relieving acute pain.  The best and most effective treatment is regular stretching though.           1) Ear towards shoulder  2) Ear towards breast pocket  3) Eyes towards breast pocket    Hold each for 30-60 seconds.  Perform all three on both sides.  Add weight of opposite hand on top of head for additional stretch.    Lower Back Stiffness & The Quadratus Lumborum  May 8th, 2012     Up to 80% of Americans will experience low back pain at some point in their lives. The quadratus lumborus (QL) is one of the more overlooked sources of lower back pain. Frequently a source of referred pain, the quadratus lumborum is a large muscle that connects the spine to the pelvis. Connecting the last rib to the lumbar  vertebrae, the QL is integral to the following:  Stabilizing the hip  Side-to-side bending  Lifting the hip when the spine is fixed  Stabilizing the last rib during exhalation  Unfortunately, the QL is also capable of extending the lower back when the erector spinae are unable to  facilitate this motion. Relative to the erector spinae, the quadratus lumborum suffers from a mechanical disadvantage when performing this movement, but for people who spend a great deal of time seated, the QL often functions in this capacity. This situation has the potential to lead to QL fatigue, and eventually muscle spasms. Rounding of the shoulders, or hunching of the neck, upper back, and shoulders can also worsen this problem.  Because the quadratus lumborum is a deep muscle, stretching it presents challenges. But in some cases, proper stretching of the QL can relieve lower back stiffness. Static stretching of the quadratus lumborum can be accomplished as shown in the featured image at the top of the post.      FLEXERIL UP TO 3 x per day AS NEEDED, + 15 mg MOBIC.  If not enough, let Dr. Berna Spare know and we can try a mild opiate (but that can become addicting).

## 2013-07-18 NOTE — Progress Notes (Signed)
SUBJECTIVE:  [name] is a 52 y.o. 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    EXAM:  BP 114/70   Pulse 72   Temp(Src) 36.1 C (97 F) (Oral)   Resp 16   Ht 1.575 m (5\' 2" )   Wt 48.081 kg (106 lb)   BMI 19.38 kg/m2  GEN: A&O, in NAD  PSYCH: Appropriate mood and broad affect. Thoughts logical and goal oriented, well groomed and appropriately dressed. Moderate eye contact. Insight good.  MSK - Right inferior innominate sheer. Left OA tension, C4 translated right. Restriction superior glide b/l scapulae. Right iliopsoas TP, right piriformis and glut medius TP    PROCEDURE  OMM discussed. Pt wishes to proceed.  HVLA to pelvis  SCS to pelvis and iliopsoas  FPR to c-spine  Scapular release technique b/l  SCS to piriformis  PT tolerated well. Improved exam.      ASSESSMENT and PLAN:  RIGHT HIP, NECK, SHOULDER PAIN, SOMATIC DYSFUNCTION - OMT today. Shown neck and low back stretches - do at least BID.  Flexeril up to TID.  Mobic at 15 mg.  Pt to let me know if pain uncontrolled and can try mild opiate, but caution addition potential.    Milda Smart, DO 07/18/2013, 9:19 AM        Visit Diagnoses and Associated Orders, Previous Medication List, Allergies -- Summary:      No diagnosis found.        Medications:  Current Outpatient Prescriptions   Medication Sig    albuterol sulfate (PROVENTIL OR VENTOLIN) 90 mcg/actuation Inhalation HFA Aerosol Inhaler Take 1-2 Puffs by inhalation Every 6 hours as needed    cholecalciferol, vitamin D3, 1,000 unit Oral Tablet Take  1,000 Units by mouth Once a day    Meloxicam (MOBIC) 15 mg Oral Tablet Take 1 Tab (15 mg total) by mouth Once a day    PARoxetine (PAXIL) 20 mg Oral Tablet Take 1 Tab (20 mg total) by mouth Every morning    ranitidine (ZANTAC) 150 mg Oral Tablet Take 1 Tab (150 mg total) by mouth Twice daily        Allergies:  Allergies   Allergen Reactions    Codeine Hives/ Urticaria

## 2013-07-24 ENCOUNTER — Other Ambulatory Visit: Payer: Self-pay | Admitting: Neurology

## 2013-07-24 ENCOUNTER — Other Ambulatory Visit (HOSPITAL_BASED_OUTPATIENT_CLINIC_OR_DEPARTMENT_OTHER): Payer: Self-pay | Admitting: Neurology

## 2013-07-28 ENCOUNTER — Ambulatory Visit (HOSPITAL_BASED_OUTPATIENT_CLINIC_OR_DEPARTMENT_OTHER)
Admission: RE | Admit: 2013-07-28 | Discharge: 2013-07-28 | Disposition: A | Discharge status: Home / Self Care | Payer: MEDICAID | Source: Ambulatory Visit | Attending: Neurology | Admitting: Neurology

## 2013-07-28 DIAGNOSIS — Q8500 Neurofibromatosis, unspecified: Secondary | ICD-10-CM | POA: Insufficient documentation

## 2013-07-28 MED ADMIN — gadobutroL 7.5 mmol/7.5 mL (1 mmol/mL) intravenous solution: 5 mL | INTRAVENOUS | NDC 50419032511

## 2013-07-28 MED FILL — gadobutroL 7.5 mmol/7.5 mL (1 mmol/mL) intravenous solution: 5.0000 mL | INTRAVENOUS | Qty: 7.5 | Status: AC

## 2013-08-05 NOTE — Procedures (Signed)
 PROCEDURE  OMM discussed. Pt wishes to proceed.  Suboccipital inhibition  HVLA to pelvis  SCS to pelvis and iliopsoas  FPR to c-spine  Scapular release technique b/l  SCS to piriformis  PT tolerated well. Improved exam.

## 2013-08-09 HISTORY — PX: SKIN SURGERY: SHX2413

## 2013-08-13 ENCOUNTER — Other Ambulatory Visit (INDEPENDENT_AMBULATORY_CARE_PROVIDER_SITE_OTHER): Payer: Self-pay

## 2013-08-23 ENCOUNTER — Ambulatory Visit (INDEPENDENT_AMBULATORY_CARE_PROVIDER_SITE_OTHER): Payer: MEDICAID

## 2013-08-23 VITALS — BP 116/74 | HR 72 | Temp 96.3°F | Resp 16 | Ht 62.0 in | Wt 107.0 lb

## 2013-08-23 DIAGNOSIS — Z23 Encounter for immunization: Principal | ICD-10-CM

## 2013-08-23 DIAGNOSIS — M9981 Other biomechanical lesions of cervical region: Secondary | ICD-10-CM

## 2013-08-23 DIAGNOSIS — M9901 Segmental and somatic dysfunction of cervical region: Secondary | ICD-10-CM

## 2013-08-23 DIAGNOSIS — G5701 Lesion of sciatic nerve, right lower limb: Secondary | ICD-10-CM

## 2013-08-23 DIAGNOSIS — M9905 Segmental and somatic dysfunction of pelvic region: Secondary | ICD-10-CM

## 2013-08-23 DIAGNOSIS — M9907 Segmental and somatic dysfunction of upper extremity: Secondary | ICD-10-CM

## 2013-08-23 DIAGNOSIS — G5700 Lesion of sciatic nerve, unspecified lower limb: Secondary | ICD-10-CM

## 2013-08-23 DIAGNOSIS — M999 Biomechanical lesion, unspecified: Secondary | ICD-10-CM

## 2013-08-23 DIAGNOSIS — M9900 Segmental and somatic dysfunction of head region: Secondary | ICD-10-CM

## 2013-08-23 NOTE — Progress Notes (Signed)
[facility] FOLLOW-[facility]ISIT    SUBJECTIVE:  [name] is a 53 y.o. female with a chief complaint of   Chief Complaint   Patient presents with    Neck Pain     OMT     RIGHT HIP PAIN  Right foot will go numb after sitting for 30-43min  Improved with up and walking around  Pain is right in the buttock - also worse with sitting  Better with up and walking around a bit  'Aching pain" and seems to get a charley horse in the right calf - associated with right hip pain  OMT quite helpful from last time, not as painful today as it was before  Doing piriformis stretches on her back daily.    Had right elbow surgery to release the ulnar nerve  Which has improved her issues with dropping things  But the right thumb still bothers her - lots of pain   And a surgery was not approved by her insurance to release that    EXAM:  BP 116/74   Pulse 72   Temp(Src) 35.7 C (96.3 F) (Oral)   Resp 16   Ht 1.575 m (5\' 2" )   Wt 48.535 kg (107 lb)   BMI 19.57 kg/m2  GEN: A&O, in NAD  PSYCH: Appropriate mood and broad affect. Thoughts logical and goal oriented, well groomed and appropriately dressed. Moderate eye contact. Insight good.  MSK: OA tension on left with OA sideslipped right.   SKIN: right olecranon with well healed incision with running suture still in place. PT states surgery done on 1/5 but doesn't have an appointment for suture removal until 1/20.   MSK: Right anterior innominate, left posterior innominate.  TP right piriformis. OA restriction, c3 translated right. Right thenar eminence strain (adductor policis mm).     PROCEDURE  OMM discussed. Pt wishes to proceed.   HLVA b/l innominates.  Suboccipital inhibition, FPR c-spine  MFR right thenar eminence  SCS right piriformis  Pt tolerated well with improved exam and sx    ASSESSMENT and PLAN:  RIGHT PIRIFORMIS SYNDOME - Continue stretches. OMT today.    SUTURES - incision well healed today.  Removed sutures.  Important for pt to keep f/u appointment with  surgeon as scheduled on 1/20.    [name], DO 08/23/2013, 1:45 PM        Visit Diagnoses and Associated Orders, Previous Medication List, Allergies -- Summary:        ICD-9-CM    1. Need for influenza vaccination V04.81 INFLUENZA VACCINE  IM AGE 57 THROUGH ADULT (ADMIN)           Medications:  Current Outpatient Prescriptions   Medication Sig    albuterol sulfate (PROAIR HFA) 90 mcg/actuation Inhalation HFA Aerosol Inhaler Take 1-2 Puffs by inhalation Every 6 hours as needed    cholecalciferol, vitamin D3, 1,000 unit Oral Tablet Take 1,000 Units by mouth Once a day    cyclobenzaprine (FLEXERIL) 5 mg Oral Tablet Take 1 Tab (5 mg total) by mouth Three times a day as needed for Muscle spasms    HYDROcodone-acetaminophen (NORCO) 5-325 mg Oral Tablet Take 1 Tab by mouth Every 8 hours as needed for Pain    Meloxicam (MOBIC) 15 mg Oral Tablet Take 1 Tab (15 mg total) by mouth Once a day    PARoxetine (PAXIL) 20 mg Oral Tablet Take 1 Tab (20 mg total) by mouth Every morning    ranitidine (ZANTAC) 150  mg Oral Tablet Take 1 Tab (150 mg total) by mouth Twice daily        Allergies:  Allergies   Allergen Reactions    Codeine Hives/ Urticaria

## 2013-08-23 NOTE — Progress Notes (Signed)
BP 116/74   Pulse 72   Temp(Src) 35.7 C (96.3 F) (Oral)   Resp 16   Ht 1.575 m (5\' 2" )   Wt 48.535 kg (107 lb)   BMI 19.57 kg/m2  Oneita Kras, MA 08/23/2013, 1:20 PM

## 2013-09-10 NOTE — Procedures (Signed)
PROCEDURE  OMM discussed. Pt wishes to proceed.   HLVA b/l innominates.  Suboccipital inhibition, FPR c-spine  MFR right thenar eminence  SCS right piriformis  Pt tolerated well with improved exam and sx

## 2013-09-27 ENCOUNTER — Ambulatory Visit (INDEPENDENT_AMBULATORY_CARE_PROVIDER_SITE_OTHER): Payer: MEDICAID

## 2013-09-27 ENCOUNTER — Encounter (INDEPENDENT_AMBULATORY_CARE_PROVIDER_SITE_OTHER): Payer: Self-pay

## 2013-09-27 VITALS — BP 126/78 | HR 82 | Temp 96.7°F | Resp 14 | Ht 62.5 in | Wt 108.0 lb

## 2013-09-27 DIAGNOSIS — M9981 Other biomechanical lesions of cervical region: Secondary | ICD-10-CM

## 2013-09-27 DIAGNOSIS — M9903 Segmental and somatic dysfunction of lumbar region: Secondary | ICD-10-CM

## 2013-09-27 DIAGNOSIS — M9906 Segmental and somatic dysfunction of lower extremity: Secondary | ICD-10-CM

## 2013-09-27 DIAGNOSIS — G5701 Lesion of sciatic nerve, right lower limb: Secondary | ICD-10-CM

## 2013-09-27 DIAGNOSIS — G5700 Lesion of sciatic nerve, unspecified lower limb: Secondary | ICD-10-CM

## 2013-09-27 DIAGNOSIS — M999 Biomechanical lesion, unspecified: Secondary | ICD-10-CM

## 2013-09-27 MED ORDER — MELOXICAM 15 MG TABLET
15.0000 mg | ORAL_TABLET | Freq: Every day | ORAL | Status: DC
Start: 2013-09-27 — End: 2014-09-10

## 2013-09-27 MED ORDER — CYCLOBENZAPRINE 10 MG TABLET
10.00 mg | ORAL_TABLET | Freq: Three times a day (TID) | ORAL | Status: DC | PRN
Start: 2013-09-27 — End: 2013-11-26

## 2013-09-27 NOTE — Progress Notes (Signed)
BP 126/78   Pulse 82   Temp(Src) 35.9 C (96.7 F) (Oral)   Resp 14   Ht 1.588 m (5' 2.5")   Wt 48.988 kg (108 lb)   BMI 19.43 kg/m2

## 2013-09-27 NOTE — Patient Instructions (Signed)
PIRIFORMIS SYNDROME (RIGHT) - is a spasm of the muscle in the buttocks that puts pressure on the sciatic nerve and causes nerve pain that runs down the leg.  Try to keep your toes rotated in especially while driving.  Try not to sit too long.  Don't put anything in your back jeans pockets while sitting.  Keep up with the stretches. Try higher dose of flexeril.  Use rice sock on the neck to put pressure points to help the muscles to relax.

## 2013-09-27 NOTE — Progress Notes (Signed)
[facility] FOLLOW-UP [facility]ISIT    SUBJECTIVE:  [name] is a 53 y.o. female with a chief complaint of   Chief Complaint   Patient presents with    Neck Pain     RIGHT SCIATICA  Standing still or walking make it worse  Getting tingling in the toes and in the calf "like something has been asleep and then it's waking up"  Does have some issues with foot drop - not sure when it happens  Still doing piriformis stretches - it's a little better after the stretches for a few minutes  Did get some help from OMT but it has been a while  Heat and ice and ibuprofen do not seem to help it.    EXAM:  BP 126/78   Pulse 82   Temp(Src) 35.9 C (96.7 F) (Oral)   Resp 14   Ht 1.588 m (5' 2.5")   Wt 48.988 kg (108 lb)   BMI 19.43 kg/m2  GEN: A&O, in NAD  PSYCH: Appropriate mood and broad affect. Thoughts logical and goal oriented, well groomed and appropriately dressed. Moderate eye contact. Insight good.  MSK:  Right piriformis TP and right lumbar erector spinae TP, tension b/l traps and scalenes with restriction at the OA.    PROCEDURE  OMM discussed. Pt wishes to proceed.  Suboccipital inhbiition   FPR to c-spine  SCS to right piriformis and lumbar erector spinae  Pt tolerated well with improved exam and sx.      ASSESSMENT and PLAN:  SCIATICA - suspect 2/2 piriformis syndrome. Increase flexeril to 10 mg. Continue stretch at least BID. Heat. F/u in 4 weeks for OMT.    [name], DO 09/27/2013, 14:03        Visit Diagnoses and Associated Orders, Previous Medication List, Allergies -- Summary:      No diagnosis found.        Medications:  Current Outpatient Prescriptions   Medication Sig    albuterol sulfate (PROAIR HFA) 90 mcg/actuation Inhalation HFA Aerosol Inhaler Take 1-2 Puffs by inhalation Every 6 hours as needed    cholecalciferol, vitamin D3, 1,000 unit Oral Tablet Take 1,000 Units by mouth Once a day    cyclobenzaprine (FLEXERIL) 5 mg Oral Tablet Take 1 Tab (5 mg total) by mouth Three times a  day as needed for Muscle spasms    HYDROcodone-acetaminophen (NORCO) 5-325 mg Oral Tablet Take 1 Tab by mouth Every 8 hours as needed for Pain    Meloxicam (MOBIC) 15 mg Oral Tablet Take 1 Tab (15 mg total) by mouth Once a day    PARoxetine (PAXIL) 20 mg Oral Tablet Take 1 Tab (20 mg total) by mouth Every morning    ranitidine (ZANTAC) 150 mg Oral Tablet Take 1 Tab (150 mg total) by mouth Twice daily        Allergies:  Allergies   Allergen Reactions    Codeine Hives/ Urticaria

## 2013-10-14 NOTE — Procedures (Signed)
PROCEDURE  OMM discussed. Pt wishes to proceed.  Suboccipital inhbiition   FPR to c-spine  SCS to right piriformis and lumbar erector spinae  Pt tolerated well with improved exam and sx.

## 2013-10-25 ENCOUNTER — Encounter (INDEPENDENT_AMBULATORY_CARE_PROVIDER_SITE_OTHER): Payer: MEDICAID

## 2013-11-06 ENCOUNTER — Other Ambulatory Visit (INDEPENDENT_AMBULATORY_CARE_PROVIDER_SITE_OTHER): Payer: Self-pay

## 2013-11-26 ENCOUNTER — Encounter (INDEPENDENT_AMBULATORY_CARE_PROVIDER_SITE_OTHER): Payer: Self-pay

## 2013-11-26 ENCOUNTER — Ambulatory Visit (INDEPENDENT_AMBULATORY_CARE_PROVIDER_SITE_OTHER): Payer: MEDICAID

## 2013-11-26 VITALS — BP 120/72 | HR 76 | Temp 96.9°F | Resp 16 | Ht 62.5 in | Wt 112.0 lb

## 2013-11-26 DIAGNOSIS — F17200 Nicotine dependence, unspecified, uncomplicated: Secondary | ICD-10-CM

## 2013-11-26 DIAGNOSIS — M9981 Other biomechanical lesions of cervical region: Secondary | ICD-10-CM

## 2013-11-26 DIAGNOSIS — M999 Biomechanical lesion, unspecified: Secondary | ICD-10-CM

## 2013-11-26 DIAGNOSIS — M62838 Other muscle spasm: Secondary | ICD-10-CM

## 2013-11-26 DIAGNOSIS — N951 Menopausal and female climacteric states: Secondary | ICD-10-CM

## 2013-11-26 DIAGNOSIS — F919 Conduct disorder, unspecified: Secondary | ICD-10-CM

## 2013-11-26 DIAGNOSIS — E785 Hyperlipidemia, unspecified: Secondary | ICD-10-CM

## 2013-11-26 DIAGNOSIS — F69 Unspecified disorder of adult personality and behavior: Secondary | ICD-10-CM

## 2013-11-26 MED ORDER — CYCLOBENZAPRINE 5 MG TABLET
ORAL_TABLET | ORAL | Status: DC
Start: 2013-11-26 — End: 2014-02-06

## 2013-11-26 MED ORDER — PAROXETINE 30 MG TABLET
30.00 mg | ORAL_TABLET | Freq: Every morning | ORAL | Status: DC
Start: 2013-11-26 — End: 2014-11-27

## 2013-11-26 MED ORDER — NICOTINE 7 MG/24 HR DAILY TRANSDERMAL PATCH
7.0000 mg | MEDICATED_PATCH | Freq: Every day | TRANSDERMAL | Status: DC
Start: 2013-11-26 — End: 2014-02-06

## 2013-11-26 NOTE — Patient Instructions (Addendum)
SCALENE (NECK) MUSCLE TENSION            The scalene muscles connect from the upper and middle neck bones to the ribs.  When they contract, they move the head from side to side (ear towards shoulder). They also help to stabilize the neck from jarring or whiplash.   The scalenes can easily get strained from chronically holding the shoulders up too high (like often happens with typing on a computer or gripping a steering wheel while driving for long periods of time) or with chronic stress/anxiety and poor posture.    The brachial plexus contains all of the nerves that travel to the arm and hand - it passes just between the scalene muscles.  So spasm of the scalenes can lead to nerve pain in the arm as the nerves get pinched between the muscles.    TREATMENT   Scalene muscle tension is best treated with osteopathic manipulation, heat (such as a rice sock applied for 20 min several times per day) and neck stretches performed at least twice daily.   Also work on improving your posture - sitting up straight and keeping your head balanced on top of your spine. Focus on relaxing your shoulders and neck, even when you are stressed.   Avoid carrying heavy bags only on one shoulder which can stress one side of your neck more than the other.   Sometimes muscle relaxant medications are prescribed, and tylenol or ibuprofen can be helpful for relieving acute pain.  The best and most effective treatment is regular stretching though.           1) Ear towards shoulder  2) Ear towards breast pocket  3) Eyes towards breast pocket    Hold each for 30-60 seconds.  Perform all three on both sides.  Add weight of opposite hand on top of head for additional stretch.        PAXIL - increase to 30 mg daily.  NECK - flexeril 5-10 mg up to 3x per day as needed.  Heat. Stretches.

## 2013-11-26 NOTE — Progress Notes (Signed)
BP 120/72   Pulse 76   Temp(Src) 36.1 C (96.9 F) (Oral)   Resp 16   Ht 1.588 m (5' 2.5")   Wt 50.803 kg (112 lb)   BMI 20.15 kg/m2  [name] L. Deric Bocock, LPN  7/84/7841, 28:20

## 2013-11-26 NOTE — Progress Notes (Signed)
[facility]    SUBJECTIVE:  [name] is a 53 y.o. female with a chief complaint of   Chief Complaint   Patient presents with    Neck Pain     FITS OF RAGE  Come along with a hot flash and last about 39min  Doesn't think she would actually hit anyone, but might "cuss them out" just if they look at her wrong  Occuring about twice per week now  Onset 2 mo ago  Had surgical menopause (hysterectomy and BSO) 2 yrs ago   Hot flashes started 2 years ago "off and on - they get better and they get worse"  Has cut back on the caffeine, smoking, and started exercising more.   Weaned down from 1 pot of coffee down to 2 cups per day (currently)  Has been on 20 mg Paxil for years. They tried to increase it to 20 mg BID but "I felt like a zombie."  Smoking 3/4 ppd (down from 1ppd).  Does plan to quit eventually.     NECK PAIN  At baseline  OMM is helpful  Not really doing the stretches regularly, but prn a couple times a week  Heat is helpful    EXAM:  BP 120/72   Pulse 76   Temp(Src) 36.1 C (96.9 F) (Oral)   Resp 16   Ht 1.588 m (5' 2.5")   Wt 50.803 kg (112 lb)   BMI 20.15 kg/m2  GEN: A&O, in NAD  PSYCH: Appropriate mood and broad affect. Thoughts logical and goal oriented, well groomed and appropriately dressed. Moderate eye contact. Insight good.  MSK: C3 sideslipped right. Right scalene tension, right shoulder elevated.  Restriction b/l traps/rhomboids/ribs.     PROCEDURE  OMM discussed. Pt wishes to proceed.  Suboccipital inhbiition   FPR to c-spine  Soft tissue to scalenes/traps  Rib raising b/l  Scapular release on right  Pt tolerated well with improved exam and sx.      ASSESSMENT and PLAN:  HOT FLASHES/RAGE - Increase paxil to 30 mg daily.  Check LH, FSH, TSH.  HYPERLIPIDEMIA - recheck FLP now that she has made lifestyle changes.  TOBACCO - Rx for patches today.  However, with Medicaid will need to go through [facility].  Info given.  NECK PAIN - OMT today. Flexeril prn. Heat.  Stretches. F/U in 2-3 weeks OMT.  [name], DO 11/26/2013, 15:11        Visit Diagnoses and Associated Orders, Previous Medication List, Allergies -- Summary:        ICD-9-CM   1. Hot flash, menopausal 627.2           Medications:  Current Outpatient Prescriptions   Medication Sig    albuterol sulfate (PROAIR HFA) 90 mcg/actuation Inhalation HFA Aerosol Inhaler Take 1-2 Puffs by inhalation Every 6 hours as needed    cholecalciferol, vitamin D3, 1,000 unit Oral Tablet Take 1,000 Units by mouth Once a day    Meloxicam (MOBIC) 15 mg Oral Tablet Take 1 Tab (15 mg total) by mouth Once a day    ranitidine (ZANTAC) 150 mg Oral Tablet Take 1 Tab (150 mg total) by mouth Twice daily        Allergies:  Allergies   Allergen Reactions    Codeine Hives/ Urticaria

## 2013-12-24 ENCOUNTER — Encounter (INDEPENDENT_AMBULATORY_CARE_PROVIDER_SITE_OTHER): Payer: Self-pay

## 2013-12-24 ENCOUNTER — Ambulatory Visit (INDEPENDENT_AMBULATORY_CARE_PROVIDER_SITE_OTHER): Payer: MEDICAID

## 2013-12-24 VITALS — BP 113/78 | HR 60 | Temp 97.5°F | Resp 16 | Ht 62.21 in | Wt 112.0 lb

## 2013-12-24 DIAGNOSIS — F32A Depression, unspecified: Secondary | ICD-10-CM

## 2013-12-24 DIAGNOSIS — M999 Biomechanical lesion, unspecified: Secondary | ICD-10-CM

## 2013-12-24 DIAGNOSIS — M9981 Other biomechanical lesions of cervical region: Secondary | ICD-10-CM

## 2013-12-24 DIAGNOSIS — F329 Major depressive disorder, single episode, unspecified: Secondary | ICD-10-CM

## 2013-12-24 DIAGNOSIS — F3289 Other specified depressive episodes: Secondary | ICD-10-CM

## 2013-12-24 NOTE — Procedures (Signed)
PROCEDURE  OMM discussed. Pt wishes to proceed.  Suboccipital inhbiition   FPR and articulatory to c-spine  HVLA to thorax, rib raising  Scapular release on left  Pt tolerated well with improved exam and sx.

## 2013-12-24 NOTE — Progress Notes (Signed)
BP 113/78    Pulse 60    Temp(Src) 36.4 C (97.5 F) (Oral)    Resp 16    Ht 1.58 m (5' 2.21")    Wt 50.803 kg (112 lb)    BMI 20.35 kg/m2     Vitals obtained by [place]

## 2013-12-24 NOTE — Progress Notes (Signed)
[facility] FO[facility]IS[facility]    SUBJECTIVE:  [name] is a 53 y.o. female with a chief complaint of   Chief Complaint   Patient presents with    Neck Pain     RAGE, HOT FLASHES  Paxil increased at last visit  Doing a lot better - no longer any rage episodes  No improvement in hot flashes  Tried black cohosh a while ago and didn't seem to help any    NECK PAIN  No improvement  Causes pain to look over shoulder or looking up  Has been using rice sock which helpful in that moment  Stretches just every once in a while  Does feel the OMT is helpful - lasts 2 days though only    EXAM:  BP 113/78    Pulse 60    Temp(Src) 36.4 C (97.5 F) (Oral)    Resp 16    Ht 1.58 m (5' 2.21")    Wt 50.803 kg (112 lb)    BMI 20.35 kg/m2     GEN: A&O, in NAD  PSYCH: Appropriate mood and broad affect. Thoughts logical and goal oriented, well groomed and appropriately dressed. Moderate eye contact. Insight good.  MSK: left OA locked, thorax rotated left, left ribs posterior, C-spine sidebent right, left rhomboid tension    PROCEDURE  OMM discussed. Pt wishes to proceed.  Suboccipital inhbiition   FPR and articulatory to c-spine  HVLA to thorax, rib raising  Scapular release on left  Pt tolerated well with improved exam and sx.    ASSESSMENT and PLAN:  NECK PAIN - OMM today.  Increase frequency of flexeril use and neck stretches.   RAGE - continue current tx with Paxil  F/u 2 mo    [place], DO 12/24/2013, 14:41        Visit Diagnoses and Associated Orders, Previous Medication List, Allergies -- Summary:      No diagnosis found.        Medications:  Current Outpatient Prescriptions   Medication Sig    albuterol sulfate (PROAIR HFA) 90 mcg/actuation Inhalation HFA Aerosol Inhaler Take 1-2 Puffs by inhalation Every 6 hours as needed    cyclobenzaprine (FLEXERIL) 5 mg Oral Tablet 1-2 tablets as needed for neck pain up to 3x per day.    Meloxicam (MOBIC) 15 mg Oral Tablet Take 1 Tab (15 mg total) by mouth Once a  day    MULTIVITS-MIN/IRON/FA/LUTEIN (CENTRUM SILVER WOMEN ORAL) Take 1 Tab by mouth Once a day    nicotine (NICODERM CQ) 7 mg/24 hr Transdermal Patch 24 hr 1 Patch (7 mg total) by Transdermal route Once a day (Patient not taking: Reported on 12/24/2013)    PARoxetine HCl (PAXIL) 30 mg Oral Tablet Take 1 Tab (30 mg total) by mouth Every morning    ranitidine (ZANTAC) 150 mg Oral Tablet Take 1 Tab (150 mg total) by mouth Twice daily        Allergies:  Allergies   Allergen Reactions    Codeine Hives/ Urticaria

## 2013-12-25 ENCOUNTER — Ambulatory Visit
Admission: RE | Admit: 2013-12-25 | Discharge: 2013-12-25 | Disposition: A | Discharge status: Home / Self Care | Payer: MEDICAID | Source: Ambulatory Visit

## 2013-12-25 DIAGNOSIS — F919 Conduct disorder, unspecified: Secondary | ICD-10-CM | POA: Insufficient documentation

## 2013-12-25 DIAGNOSIS — F69 Unspecified disorder of adult personality and behavior: Secondary | ICD-10-CM

## 2013-12-25 DIAGNOSIS — N951 Menopausal and female climacteric states: Secondary | ICD-10-CM | POA: Insufficient documentation

## 2013-12-25 DIAGNOSIS — E785 Hyperlipidemia, unspecified: Secondary | ICD-10-CM | POA: Insufficient documentation

## 2013-12-25 LAB — LH: LUTEINIZING HORMONE: 42.2 m[IU]/mL

## 2013-12-25 LAB — LIPID PANEL
CHOL/HDL RATIO: 3.4
CHOLESTEROL: 265 mg/dL — ABNORMAL HIGH (ref 120–199)
HDL-CHOLESTEROL: 79 mg/dL — ABNORMAL HIGH (ref 45–65)
LDL (CALCULATED): 158 mg/dL — ABNORMAL HIGH (ref <130)
TRIGLYCERIDES: 142 mg/dL — ABNORMAL HIGH (ref 35–135)
VLDL (CALCULATED): 28 mg/dL (ref 5–35)

## 2013-12-25 LAB — TSH CASCADE,3RD GEN WITH REFLEX TO FT4: TSH CASCADE: 3.139 u[IU]/mL (ref 0.340–5.6)

## 2013-12-25 NOTE — Procedures (Signed)
PROCEDURE  OMM discussed. Pt wishes to proceed.  Suboccipital inhbiition   FPR to c-spine  Soft tissue to scalenes/traps  Rib raising b/l  Scapular release on right  Pt tolerated well with improved exam and sx.

## 2013-12-27 LAB — VITAMIN D, SERUM (25 HYDROXYVITAMIN D2 AND D3 BY MS)
25 HYDROXYVITAMIN D2/D3,total: 43.4 ng/mL (ref <100)
25 HYDROXYVITAMIN D2: <4 ng/mL
25 HYDROXYVITAMIN D3: 43.4 ng/mL

## 2014-01-12 ENCOUNTER — Other Ambulatory Visit (INDEPENDENT_AMBULATORY_CARE_PROVIDER_SITE_OTHER): Payer: Self-pay

## 2014-01-31 ENCOUNTER — Encounter (INDEPENDENT_AMBULATORY_CARE_PROVIDER_SITE_OTHER): Payer: Self-pay

## 2014-01-31 NOTE — Progress Notes (Signed)
Patient states she has a nail fungus on fingernail. Suggested OTC Fungi nail until she can be seen by physician next week.  Patient agreed to plan.  All questions answered.

## 2014-02-06 ENCOUNTER — Ambulatory Visit (INDEPENDENT_AMBULATORY_CARE_PROVIDER_SITE_OTHER): Payer: MEDICAID | Admitting: GENERAL SURGERY

## 2014-02-06 ENCOUNTER — Encounter (INDEPENDENT_AMBULATORY_CARE_PROVIDER_SITE_OTHER): Payer: Self-pay | Admitting: GENERAL SURGERY

## 2014-02-06 VITALS — BP 112/79 | HR 88 | Temp 98.6°F | Resp 16 | Ht 62.5 in | Wt 112.0 lb

## 2014-02-06 DIAGNOSIS — Q8502 Neurofibromatosis, type 2: Secondary | ICD-10-CM

## 2014-02-07 ENCOUNTER — Encounter (INDEPENDENT_AMBULATORY_CARE_PROVIDER_SITE_OTHER): Payer: MEDICAID

## 2014-02-14 ENCOUNTER — Ambulatory Visit (HOSPITAL_BASED_OUTPATIENT_CLINIC_OR_DEPARTMENT_OTHER): Payer: MEDICAID

## 2014-02-20 ENCOUNTER — Telehealth (INDEPENDENT_AMBULATORY_CARE_PROVIDER_SITE_OTHER): Payer: Self-pay | Admitting: GENERAL SURGERY

## 2014-02-20 NOTE — Telephone Encounter (Signed)
PT IS TO REPORT TO THE ODS @ 8:45 AM- PT IS AWARE OF THIS TIME

## 2014-02-21 ENCOUNTER — Encounter (HOSPITAL_BASED_OUTPATIENT_CLINIC_OR_DEPARTMENT_OTHER)
Admission: RE | Disposition: A | Discharge status: Home / Self Care | Payer: Self-pay | Source: Ambulatory Visit | Attending: GENERAL SURGERY

## 2014-02-21 ENCOUNTER — Inpatient Hospital Stay (HOSPITAL_BASED_OUTPATIENT_CLINIC_OR_DEPARTMENT_OTHER)
Admission: RE | Admit: 2014-02-21 | Discharge: 2014-02-21 | Disposition: A | Discharge status: Home / Self Care | Payer: MEDICAID | Source: Ambulatory Visit | Attending: GENERAL SURGERY | Admitting: GENERAL SURGERY

## 2014-02-21 ENCOUNTER — Other Ambulatory Visit (HOSPITAL_COMMUNITY): Payer: Self-pay | Admitting: GENERAL SURGERY

## 2014-02-21 ENCOUNTER — Encounter (HOSPITAL_BASED_OUTPATIENT_CLINIC_OR_DEPARTMENT_OTHER): Payer: Self-pay

## 2014-02-21 DIAGNOSIS — D216 Benign neoplasm of connective and other soft tissue of trunk, unspecified: Secondary | ICD-10-CM

## 2014-02-21 DIAGNOSIS — D235 Other benign neoplasm of skin of trunk: Secondary | ICD-10-CM | POA: Insufficient documentation

## 2014-02-21 DIAGNOSIS — D214 Benign neoplasm of connective and other soft tissue of abdomen: Secondary | ICD-10-CM

## 2014-02-21 DIAGNOSIS — F3289 Other specified depressive episodes: Secondary | ICD-10-CM | POA: Insufficient documentation

## 2014-02-21 DIAGNOSIS — F17200 Nicotine dependence, unspecified, uncomplicated: Secondary | ICD-10-CM | POA: Insufficient documentation

## 2014-02-21 DIAGNOSIS — J449 Chronic obstructive pulmonary disease, unspecified: Secondary | ICD-10-CM | POA: Insufficient documentation

## 2014-02-21 DIAGNOSIS — J4489 Other specified chronic obstructive pulmonary disease: Secondary | ICD-10-CM | POA: Insufficient documentation

## 2014-02-21 DIAGNOSIS — D2110 Benign neoplasm of connective and other soft tissue of unspecified upper limb, including shoulder: Secondary | ICD-10-CM

## 2014-02-21 DIAGNOSIS — D2360 Other benign neoplasm of skin of unspecified upper limb, including shoulder: Secondary | ICD-10-CM | POA: Insufficient documentation

## 2014-02-21 DIAGNOSIS — F329 Major depressive disorder, single episode, unspecified: Secondary | ICD-10-CM | POA: Insufficient documentation

## 2014-02-21 SURGERY — EXCISIONAL BIOPSY UPPER EXTREMITY
Anesthesia: Local (Nurse-Monitored) | Site: Back | Laterality: Right | Wound class: Clean Wound: Uninfected operative wounds in which no inflammation occurred

## 2014-02-21 MED ORDER — BUPIVACAINE-EPINEPHRINE (PF) 0.25 %-1:200,000 INJECTION SOLUTION
10.00 mL | Freq: Once | INTRAMUSCULAR | Status: AC
Start: 2014-02-21 — End: 2014-02-21
  Administered 2014-02-21: 43 mL via INTRAMUSCULAR
  Filled 2014-02-21: qty 2
  Filled 2014-02-21: qty 1
  Filled 2014-02-21: qty 2

## 2014-02-21 MED ORDER — BUPIVACAINE-EPINEPHRINE (PF) 0.25 %-1:200,000 INJECTION SOLUTION
INTRAMUSCULAR | Status: AC
Start: 2014-02-21 — End: 2014-02-21
  Filled 2014-02-21: qty 1

## 2014-02-21 SURGICAL SUPPLY — 14 items
CLOSURE SKIN STRIPS 1/2X4IN_R1547 6/PK 50PK/BX (WOUND CARE/ENTEROSTOMAL SUPPLY) ×3
CONV USE ITEM 338068 - KIT SURG SM STUP NONST DISP LF (CUSTOM TRAYS & PACK) ×1 IMPLANT
DRAPE REINF FEN 121.5X106X77IN THR 121.5INX106INX77IN CNVRT (PROTECTIVE PRODUCTS/GARMENTS) ×2 IMPLANT
GOWN SURG XL AAMI L4 IMPRV REI NF BRTHBL STRL LF DISP CNVRT (PROTECTIVE PRODUCTS/GARMENTS) ×2 IMPLANT
KIT SURG SM STUP NONST DISP LF (CUSTOM TRAYS & PACK) ×1
NEEDLE HYPO  25GA 1.5IN MONOJECT MAGELLAN SS BVL ORT SLF LEVEL SHEATH SFSHLD STD LL SYRG ORNG STRL (Syringes w/ o Needles) ×1 IMPLANT
NEEDLE HYPO 25GA 1.5IN MONOJE CT MGLN SS BVL ORT SLF LEVEL (Syringes w/ o Needles) ×1
PACK SM SET UP_DYNJ906635 (CUSTOM TRAYS & PACK) ×1
SPONGE GAUZE STRL 4 X 4IN TUB_6939 1280/CS (WOUND CARE SUPPLY) ×1 IMPLANT
SPONGE GAUZE STRL 4 X 4IN TUB_6939 1280/CS (WOUND CARE/ENTEROSTOMAL SUPPLY) ×1
STRIP 4X.5IN STRSTRP PLSTR REINF SKNCLS WHT STRL LF (WOUND CARE SUPPLY) ×3 IMPLANT
SYRINGE HYPO 10CC LL 309604 100/BX (Syringes w/ o Needles) ×2 IMPLANT
TRAY SKIN SCRUB 8IN VNYL COTTON 6 WNG 6 SPONGE STICK 2 TIP APPL DRY STRL LF (KITS & TRAYS (DISPOSABLE)) ×1 IMPLANT
TRAY SURG PREP SCR CR ESTM (KITS & TRAYS (DISPOSABLE)) ×1

## 2014-02-21 NOTE — Brief Op Note (Signed)
[facility]  [place], [place] [address]         BRIEF OPERATIVE NOTE    Patient Name: [facility] Number: [id number]  Date of Service: 02/21/2014   Date of Birth: [birth year]      Pre-Operative Diagnosis: SKIN LESION RIGHT SHOULDER, RIGHT BACK LESION AND RIGHT UPPER QUADRANT ABDOMEN    Post-Operative Diagnosis: SKIN LESION RIGHT SHOULDER, RIGHT BACK LESION AND RIGHT UPPER QUADRANT ABDOMEN    Procedure:  Procedure(s):  EXCISIONAL BIOPSY OF SKIN LESION RIGHT SHOULDER AND RIGHT UPPER QUADRANT ABDOMEN    Findings:  SEE POST OP    Attending Surgeon: Cathleen Fears, DO    Anesthesia:  LOCAL                Complications:  None    EBL:  Minimal    Specimen: .SKIN LESION RIGHT SHOULDER, RIGHT BACK LESION AND RIGHT UPPER QUADRANT ABDOMEN             Disposition: Phase II           Condition: stable      Patient is at increased risk for surgical bleeding:  No    Patient is on postop antibiotic regimen greater than 24 hours:  No

## 2014-02-21 NOTE — H&P (Signed)
[facility]  [place], [place] [address]       Surgery  H&P    Simon, Alexa, 53 y.o. female  Date of Birth:  08-15-1960  Date of Admission:  02/21/2014  PCP: [name], DO      HPI    [name] is a 53 y.o. female who presents with history of multiple neurofibromatosis tumors. She desires removal of a tumor on her right shoulder and right upper quadrant of her abdomen.      PAST MEDICAL/ FAMILY/ SOCIAL HISTORY:       Past Medical History   Diagnosis Date    Chronic obstructive pulmonary disease     Anesthesia complication      O2 level dropped    Shortness of breath     Depression     Neurofibromatosis      Past Surgical History   Procedure Laterality Date    Hx gall bladder surgery/chole  2011    Hx hysterectomy  07/2011    Hx other  1971     replaced ureter to left kidney    Hx eye surgery       Eye lid reduction    Pb colonoscopy,diagnostic N/A 04/18/2013     COLONOSCOPY performed by Levie Heritage, MD at [place] ([place])     Allergies   Allergen Reactions    Codeine Hives/ Urticaria               Family History   Problem Relation Age of Onset    Hypertension Mother     Alcohol abuse Mother     Heart Attack Father     Coronary Artery Disease Father     Cancer Father      esophageal    Diabetes Sister     Thyroid Disease Sister     Cancer Maternal Aunt     Cancer Maternal Uncle     Cancer Maternal Grandmother      breast    Alzheimer's/Dementia Neg Hx     Anesth Problems Neg Hx     Stroke Neg Hx     Cancer Maternal Uncle     Cancer Maternal Uncle     Alcohol abuse Sister     Other Sister      polio     History   Substance Use Topics    Smoking status: Current Every Day Smoker -- 0.75 packs/day for 35 years     Types: Cigarettes    Smokeless tobacco: Never Used    Alcohol Use: No       ROS    Other than ROS in the HPI, all other systems were negative.      Vitals   There were no vitals filed for this visit.    PHYSICAL EXAMINATION:     General: appears in good health  Eyes: Conjunctiva clear.  HENT:Head atraumatic and normocephalic  Neck: No JVD or thyromegaly or lymphadenopathy  Lungs: Clear to auscultation bilaterally.   Cardiovascular: regular rate and rhythm  Abdomen: Soft, non-tender, Bowel sounds normal  Extremities: No cyanosis or edema  Skin: Skin warm and dry   Tumors- right shoulder 3cm, RUQ 5cm    Data Reviewed:    SIGNIFICANT RADIOLOGY:          ASSESSMENT & PLAN:      history of multiple neurofibromatosis tumors  -Excisional biopsy of 2 tumors 02/21/2014

## 2014-02-21 NOTE — Progress Notes (Signed)
[facility]                                                                              PROGRESS NOTE    PATIENT NAME: Hautala, Laird JSEGBT:[id number]  DATE OF SERVICE:02/06/2014  DATE OF BIRTH: [birth year]    CHIEF COMPLAINT:  Neurofibromatosis tumors.     HISTORY OF PRESENT ILLNESS:  This patient is a 53 year old female.  She has a past medical history of neurofibromatosis.  She has multiple skin lesions all over her body.  However, she is complaining of two right shoulder lesions and right upper quadrant abdominal lesion.  She states that these lesions are painful.  They rub against her bra and her clothes and sometimes they open up and bleed. She is requesting removal.    PAST MEDICAL HISTORY:  gastroesophageal reflux disease, neurofibromatosis, hiatal hernia, chronic obstructive pulmonary disease, arthritis.    PAST SURGICAL HISTORY:  Cholecystectomy, hysterectomy, upper and lower endoscopies.    ALLERGIES:  CODEINE.    MEDICATIONS:  Zantac, Paxil, Mobic, Proventil inhaler.      SOCIAL HISTORY:  The patient smokes 1 pack per day.  She denies alcohol use.   The patient is married.      FAMILY HISTORY: There is a family history of diabetes, hypertension, Parkinson and esophageal cancer.    PHYSICAL EXAMINATION:  The patient was afebrile.  She was hemodynamically stable.Blood pressure 112/79, pulse 88, temperature 37 C (98.6 F), temperature source Oral, resp. rate 16, height 1.588 m (5' 2.5"), weight 50.803 kg (112 lb).    HEART:  Regular rate. No murmurs gallops or rubs.    LUNGS:  Clear to auscultation bilaterally. No wheezes, rales or rhonchi    ABDOMEN:  Soft and it was nontender. No mass or hernias.    SKIN:  The patient had multiple skin tumors in various sizes all over her extremities and torso.The patient did have a lesion on the right shoulder which was approximately 3 cm in diameter and a right upper quadrant lesion which was approximately 5 cm  in diameter, which she requested removal.      REVIEW OF SYSTEMS:  The patient denies fevers, chills OR weight loss.  Denies headaches, nosebleeds or wheezing.  Denies blurred vision or double vision.  Denies chest pain, palpitations, leg swelling.  She complains of chronic cough and shortness of breath.  Denies wheezing or cough.  She complains of heartburn symptoms, denies nausea, vomiting, abdominal pain or diarrhea.  She denies painful, urgent or frequent urination.  She does complain of neck pain, back pain and joint pain.        IMPRESSION:  A 53 year old female with a neurofibromatosis tumors.  She is requesting removal of a right shoulder tumor and a right upper quadrant tumor.    PLAN:  Excisional biopsy was recommended and is currently scheduled for February 22, 2014.    Thank you, Dr. Jolly Mango, for allowing [place] to participate in the care of this patient.      Matilde Sprang, DO      TG/[id number]; D: 02/20/2014 13:00:52 T: 02/21/2014 07:17:43    cc: Lemont Fillers DO      [address]  [place], [place] [address]

## 2014-02-21 NOTE — OR Surgeon (Signed)
[facility]                                  [name], [place] [address]                                     [phone]                           OPERATIVE SUMMARY    PATIENT NAME: [name] MYTRZN:[id number]  DATE OF SERVICE:02/21/2014  DATE OF BIRTH: 08-20-1960    PREOPERATIVE DIAGNOSIS:  Neurofibromatosis skin lesions of the right shoulder, right back and right upper quadrant.    POSTOPERATIVE DIAGNOSIS:  Neurofibromatosis skin lesions and of the right shoulder, right back and right upper quadrant.    NAME OF PROCEDURE:  Excisional biopsy of neurofibromatosis skin lesions of the right shoulder, right back and right upper quadrant of the abdomen.      FINDINGS:  See postop.    SURGEON:  Dr. Matilde Sprang    ANESTHESIA TYPE:  Local.    COMPLICATIONS:  None.    ESTIMATED BLOOD LOSS:  Minimal.    SPECIMENS:  Skin lesions of the right shoulder, right back and right upper quadrant of abdomen.    INDICATIONS FOR PROCEDURE:  This patient is a 53 year old female who has a history of neurofibromatosis.  She has multiple skin lesions.  She came to the office and she requested removal of a right shoulder lesion and right upper quadrant abdomen lesion.  In preop she also wanted a right back lesion removed.  After a complete history and physical examination was performed, informed consent was obtained and placed on the chart.    DESCRIPTION OF PROCEDURE:  The patient was brought to the operating room where she was placed in the left lateral recumbent position.  The right shoulder, back and right upper quadrant of the abdomen were prepped and draped in standard sterile fashion and a timeout was performed to confirm the site of surgery.  The right shoulder lesion was palpated, the skin was marked for a skin incision.  An elliptical skin incision was planned.  The skin and subcutaneous tissue were then anesthetized with local  anesthetic.  A #15 blade scalpel was then used to make a skin incision.  Bovie electrocautery was then used to remove the lesion off of its base and the specimen was passed off of the operative field.  Hemostasis was achieved and wound irrigation was achieved.  Next, the deep dermis of the skin was reapproximated using 3-0 Vicryl with simple interrupted stitches and the skin edges were reapproximated using 4-0 Vicryl with a running subcuticular stitch.  The right back lesion and the right upper quadrant abdominal lesion were removed in a similar fashion.  A 3-0 nylon simple interrupted stitch was placed in the midportion of the right back and the right shoulder lesion for additional reinforcement.  All operative sites were cleaned and sterile dressings were applied.  The patient tolerated the procedure well.      Matilde Sprang, DO      UD/TH/[id number]; D:  02/21/2014 13:33:33; T: 02/21/2014 13:52:30    cc: Lemont Fillers DO      Guilford Shi MD      [place], [place] [address]

## 2014-02-21 NOTE — Discharge Instructions (Addendum)
DISCHARGE INSTRUCTIONS    Call Dr. Wynetta Emery [phone] today for an appointment/results    Activity: As tolerated    Diet: Regular    Bathing: Bath / shower    Care of incision or dressing: Keep dressing dry and clean    Medicine:  Resume routine medications   Notify doctor or go to the emergency room if severe pain, excessive bleeding, redness, swelling or fever develops after discharge.  Keep appointment on the 27th of july  [facility]  [place], [place] [address]     Anesthesia Discharge Instructions    1.  Do NOT drive an automobile today.    2.  Do NOT sign any legal documents for 24 hours.    3.  Do NOT operate any electrical equipment today.    Fall risk prevention education has been reviewed with the patient/significant other.

## 2014-02-26 LAB — HISTORICAL SURGICAL PATHOLOGY SPECIMEN

## 2014-03-08 ENCOUNTER — Ambulatory Visit (INDEPENDENT_AMBULATORY_CARE_PROVIDER_SITE_OTHER): Payer: MEDICAID

## 2014-03-08 ENCOUNTER — Encounter (INDEPENDENT_AMBULATORY_CARE_PROVIDER_SITE_OTHER): Payer: Self-pay

## 2014-03-08 VITALS — BP 112/60 | HR 68 | Temp 98.2°F | Resp 16 | Ht 62.5 in | Wt 112.0 lb

## 2014-03-08 DIAGNOSIS — G5700 Lesion of sciatic nerve, unspecified lower limb: Secondary | ICD-10-CM

## 2014-03-08 DIAGNOSIS — G5701 Lesion of sciatic nerve, right lower limb: Secondary | ICD-10-CM

## 2014-03-08 DIAGNOSIS — M461 Sacroiliitis, not elsewhere classified: Secondary | ICD-10-CM

## 2014-03-08 DIAGNOSIS — M999 Biomechanical lesion, unspecified: Secondary | ICD-10-CM

## 2014-03-08 MED ORDER — CYCLOBENZAPRINE 10 MG TABLET
10.0000 mg | ORAL_TABLET | Freq: Three times a day (TID) | ORAL | Status: DC | PRN
Start: 2014-03-08 — End: 2015-05-26

## 2014-03-08 MED ORDER — PREDNISONE 50 MG TABLET
50.0000 mg | ORAL_TABLET | Freq: Every day | ORAL | Status: AC
Start: 2014-03-08 — End: 2014-03-12

## 2014-03-08 NOTE — Progress Notes (Signed)
BP 112/60 mmHg   Pulse 68   Temp(Src) 36.8 C (98.2 F) (Oral)   Resp 16   Ht 1.588 m (5' 2.5")   Wt 50.803 kg (112 lb)   BMI 20.15 kg/m2  Albie Bazin L. Shaunn Tackitt, LPN  6/34/9494, 47:39

## 2014-03-08 NOTE — Procedures (Signed)
PROCEDURE  OMM discussed. Pt wishes to proceed.  HVLA to left SI joint (no effect)  HVLA to left posterior innominate  SCS and MFR to left SI joint  SCS to lumbar spine  SCS to piriformis  Pt tolerated well with improved exam and sx.

## 2014-03-08 NOTE — Progress Notes (Signed)
[facility]ISIT    SUBJECTIVE:  [name] is a 53 y.o. female with a chief complaint of   Chief Complaint   Patient presents with    Back Pain   LOW BACK PAIN  Worse the past 2 weeks  Feels like it needs to pop  Has tried to pop it at home but unable  Doing stretches  Hurts worse to sit than to lay down.  Ibuprofen has not helped at all (800mg )  Out of flexeril - it really helped with her neck issues    EXAM:  BP 112/60 mmHg   Pulse 68   Temp(Src) 36.8 C (98.2 F) (Oral)   Resp 16   Ht 1.588 m (5' 2.5")   Wt 50.803 kg (112 lb)   BMI 20.15 kg/m2  GEN: A&O, in NAD  PSYCH: Appropriate mood and broad affect. Thoughts logical and goal oriented, well groomed and appropriately dressed. Moderate eye contact. Insight good  MSK: left piriformis and SI and lumbar paraspinal tenderpoints.  Left posterior innominate.     PROCEDURE  OMM discussed. Pt wishes to proceed.  HVLA to left SI joint (no effect)  HVLA to left posterior innominate  SCS and MFR to left SI joint  SCS to lumbar spine  SCS to piriformis  Pt tolerated well with improved exam and sx.      ASSESSMENT and PLAN:  LEFT SACROILIITIS, SOMATIC DYSFUNCTION - OMT today.  Not much help - will trial Prednisone 50 mg x 4 d.  Flexeril prn.  Heat prn.      [name], DO 03/08/2014, 14:39        Visit Diagnoses and Associated Orders, Previous Medication List, Allergies -- Summary:      No diagnosis found.        Medications:  Current Outpatient Prescriptions   Medication Sig    albuterol sulfate (PROAIR HFA) 90 mcg/actuation Inhalation HFA Aerosol Inhaler Take 1-2 Puffs by inhalation Every 6 hours as needed    Meloxicam (MOBIC) 15 mg Oral Tablet Take 1 Tab (15 mg total) by mouth Once a day    MULTIVITS-MIN/IRON/FA/LUTEIN (CENTRUM SILVER WOMEN ORAL) Take 1 Tab by mouth Once a day    PARoxetine HCl (PAXIL) 30 mg Oral Tablet Take 1 Tab (30 mg total) by mouth Every morning    ranitidine (ZANTAC) 150 mg Oral Tablet Take 1 Tab (150 mg  total) by mouth Twice daily        Allergies:  Allergies   Allergen Reactions    Codeine Hives/ Urticaria

## 2014-03-15 ENCOUNTER — Telehealth (INDEPENDENT_AMBULATORY_CARE_PROVIDER_SITE_OTHER): Payer: Self-pay

## 2014-03-15 DIAGNOSIS — M533 Sacrococcygeal disorders, not elsewhere classified: Secondary | ICD-10-CM

## 2014-03-15 MED ORDER — TRAMADOL 50 MG TABLET
50.00 mg | ORAL_TABLET | Freq: Four times a day (QID) | ORAL | Status: DC | PRN
Start: 2014-03-15 — End: 2015-12-12

## 2014-03-15 NOTE — Telephone Encounter (Signed)
Pt called stating she took all of the prednisone and her back is still causing her a lot of pain. Asking if there is something else that can be called in . Please advise.    [facility]  Registration Specialist

## 2014-03-15 NOTE — Telephone Encounter (Signed)
Short course of opiate medication ultram. I wrote the Rx but she will have to pick up very soon since we close! Rx is up front with check-in team.  CF (Routing comment) - called patient to let her know. She will come right by.  CF    Wilt, Novella Rob, DO (You)  19 minutes ago  (4:27 PM)     Pt called stating she took all of the prednisone and her back is still causing her a lot of pain. Asking if there is something else that can be called in . Please advise.

## 2014-04-01 ENCOUNTER — Telehealth (INDEPENDENT_AMBULATORY_CARE_PROVIDER_SITE_OTHER): Payer: Self-pay

## 2014-04-01 DIAGNOSIS — M461 Sacroiliitis, not elsewhere classified: Secondary | ICD-10-CM

## 2014-04-01 NOTE — Telephone Encounter (Signed)
Patient called stating that she is still experiencing back pain.  She would like a referral to have her back issue addressed as talked about in her last visit.

## 2014-04-03 NOTE — Telephone Encounter (Signed)
Patient calling to see if referral has been sent in.

## 2014-04-09 NOTE — Telephone Encounter (Signed)
Referral to PT placed, but I can only assume it is to tx similar exam findings to her last visit with me on 03/08/14.  She may need a f/u visit with myself or another DO if her pain seems to have moved away from her left sacroiliac joint so that the PT order is placed correctly.  Thanks so much.  [name], DO  04/09/2014, 13:14

## 2014-04-11 ENCOUNTER — Encounter (INDEPENDENT_AMBULATORY_CARE_PROVIDER_SITE_OTHER): Payer: Self-pay | Admitting: Family Medicine

## 2014-04-30 ENCOUNTER — Other Ambulatory Visit (INDEPENDENT_AMBULATORY_CARE_PROVIDER_SITE_OTHER): Payer: Self-pay

## 2014-05-17 ENCOUNTER — Other Ambulatory Visit: Payer: Self-pay

## 2014-05-31 ENCOUNTER — Telehealth (INDEPENDENT_AMBULATORY_CARE_PROVIDER_SITE_OTHER): Payer: Self-pay

## 2014-05-31 NOTE — Telephone Encounter (Signed)
PA form faxed to [facility]. Pending response.

## 2014-06-03 NOTE — Telephone Encounter (Signed)
Approved per [employer] valid through 06/09/15.

## 2014-09-10 ENCOUNTER — Other Ambulatory Visit (INDEPENDENT_AMBULATORY_CARE_PROVIDER_SITE_OTHER): Payer: Self-pay

## 2014-09-10 NOTE — Telephone Encounter (Signed)
Last seen 03/09/15

## 2014-09-20 ENCOUNTER — Encounter (INDEPENDENT_AMBULATORY_CARE_PROVIDER_SITE_OTHER): Payer: Self-pay | Admitting: Family Medicine

## 2014-09-20 ENCOUNTER — Encounter (INDEPENDENT_AMBULATORY_CARE_PROVIDER_SITE_OTHER): Payer: Self-pay

## 2014-09-20 ENCOUNTER — Ambulatory Visit (INDEPENDENT_AMBULATORY_CARE_PROVIDER_SITE_OTHER): Payer: MEDICAID

## 2014-09-20 VITALS — BP 114/72 | HR 76 | Temp 98.1°F | Resp 16 | Ht 62.5 in | Wt 112.0 lb

## 2014-09-20 DIAGNOSIS — L409 Psoriasis, unspecified: Secondary | ICD-10-CM

## 2014-09-20 DIAGNOSIS — B9789 Other viral agents as the cause of diseases classified elsewhere: Secondary | ICD-10-CM

## 2014-09-20 DIAGNOSIS — H9193 Unspecified hearing loss, bilateral: Secondary | ICD-10-CM

## 2014-09-20 DIAGNOSIS — J069 Acute upper respiratory infection, unspecified: Secondary | ICD-10-CM

## 2014-09-20 MED ORDER — BETAMETHASONE, AUGMENTED 0.05 % TOPICAL OINTMENT
TOPICAL_OINTMENT | Freq: Two times a day (BID) | CUTANEOUS | Status: DC
Start: 2014-09-20 — End: 2014-10-07

## 2014-09-20 MED ORDER — FLUTICASONE PROPIONATE 50 MCG/ACTUATION NASAL SPRAY,SUSPENSION
1.0000 | Freq: Every day | NASAL | Status: DC
Start: 2014-09-20 — End: 2015-02-12
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
## 2014-09-20 NOTE — Patient Instructions (Signed)
Betamethasone for the psoriasis - use sparingly twice daily.      Virus - Should heal on its own.  Nasal saline (salt water) sprays in the nose both sides 4x per day.  Once ulcer healed - ok to use Flonase twice daily one spray each nostril until you are feeling better/not congested.    Vitamin C 1,000mg  daily until feeling well.  Plenty of water!  At least 1/2 gallon daily. Coffee will only further dehydrate you.

## 2014-09-20 NOTE — Progress Notes (Signed)
[facility] FO[facility]IS[facility]    SUBJECTIVE:  [name] is a 54 y.o. female with a chief complaint of   Chief Complaint   Patient presents with    Productive Cough     x 1 month    Fever Blisters     off and on    Runny Nose     x 1 month    Rash     left shin     Onset 3-4 weeks ago runny nose, wet cough  Sometimes getting frontal headaches from it - though sinuses do not feel full  Left nostril feels swollen and cannot breathe through it  Fever blisters in nose and along lips  Feels chills and feels cold, no fevers  Feels harder to breath and short of breath from coughing the AMs mostly  No wheezing  No OTC tx for this yet, drinking lots of fluids but not water    HOH  Has been going on for a while  Has h/o exposure to loud noises - worked in [employer] for years  Recalls 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  Does have a h/o many ear infections as a child.    EXAM:  BP 114/72 mmHg   Pulse 76   Temp(Src) 36.7 C (98.1 F) (Oral)   Resp 16   Ht 1.588 m (5' 2.5")   Wt 50.803 kg (112 lb)   BMI 20.15 kg/m2  GEN: A&O, in NAD  PSYCH: Appropriate mood and broad affect. Thoughts logical and goal oriented, well groomed and appropriately dressed. Moderate eye contact. Insight good.  CV: RRR, s1 and s2, no murmurs  LUNGS: CTAB no w/r/r  HEENT: nares erythematous and ulcerated on the left, TMs benign b/l, posterior OP mildly erythematous no edema. Dentures in place.   SKIN: right anterior lower leg hyeprkeratosis dried plaques with evidence of excoriations.     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   VIRAL URI - reassurance. Supportive care.  Nasal saline QID. Flonase once nose is healed. Vit C.  HOH- interested in hearing aids. Referral to audiology. Did not repeat hearing test today as pt reports previous ones have been  abnormal.    [name], DO 09/20/2014, 11:47        Visit Diagnoses and Associated Orders, Previous Medication List, Allergies -- Summary:      No diagnosis found.        Medications:  Current Outpatient Prescriptions   Medication Sig    cyclobenzaprine (FLEXERIL) 10 mg Oral Tablet Take 1 Tab (10 mg total) by mouth Three times a day as needed for Muscle spasms    Meloxicam (MOBIC) 15 mg Oral Tablet Take 1 Tab (15 mg total) by mouth Once a day    MULTIVITS-MIN/IRON/FA/LUTEIN (CENTRUM SILVER WOMEN ORAL) Take 1 Tab by mouth Once a day    PARoxetine HCl (PAXIL) 30 mg Oral Tablet Take 1 Tab (30 mg total) by mouth Every morning    PROAIR HFA 90 mcg/actuation Inhalation HFA Aerosol Inhaler INHALE 1 TO 2 PUFFS BY MOUTH EVERY 6 HOURS AS NEEDED    ranitidine (ZANTAC) 150 mg Oral Tablet Take 1 Tab (150 mg total) by mouth Twice daily    traMADol (ULTRAM) 50 mg Oral Tablet Take 1 Tab (50 mg total) by  mouth Every 6 hours as needed (Patient not taking: Reported on 09/20/2014)        Allergies:  Allergies   Allergen Reactions    Codeine Hives/ Urticaria

## 2014-09-30 ENCOUNTER — Telehealth

## 2014-09-30 NOTE — Telephone Encounter (Signed)
Medicaid prefers the following rather then the betamethasone dip ointment. Can you change?    betamethasone dipropionate cream, lotion   betamethasone valerate cream   clobetasol propionate cream/gel/ointment/solution   clobetasol emollient   fluocinonide cream, gel, solution   fluocinonide/emollient   halobetasol propionate   triamcinolone acetonide cream, ointment

## 2014-10-07 MED ORDER — BETAMETHASONE DIPROPIONATE 0.05 % TOPICAL CREAM
TOPICAL_CREAM | Freq: Two times a day (BID) | CUTANEOUS | Status: DC
Start: 2014-10-07 — End: 2015-05-26

## 2014-10-07 NOTE — Telephone Encounter (Signed)
Cream sent instead of ointment.  [name], DO  10/07/2014, 16:55

## 2014-10-30 ENCOUNTER — Telehealth (INDEPENDENT_AMBULATORY_CARE_PROVIDER_SITE_OTHER): Payer: Self-pay

## 2014-10-30 DIAGNOSIS — T753XXA Motion sickness, initial encounter: Secondary | ICD-10-CM

## 2014-10-30 NOTE — Telephone Encounter (Signed)
Patient calling to state that she is going on a ten day cruise this coming Monday.  Patient is requesting a medicated patch to help with sea sickness.  Please advise  Alexa Eng, LPN  12/17/2583, 27:78

## 2014-11-01 MED ORDER — SCOPOLAMINE 1 MG OVER 3 DAYS TRANSDERMAL PATCH
1.50 mg | MEDICATED_PATCH | TRANSDERMAL | Status: DC
Start: 2014-11-01 — End: 2015-05-26

## 2014-11-01 NOTE — Telephone Encounter (Signed)
Scopolamine patch #4 sent to pharmacy. [place], DO  11/01/2014, 16:21

## 2014-11-27 ENCOUNTER — Other Ambulatory Visit (INDEPENDENT_AMBULATORY_CARE_PROVIDER_SITE_OTHER): Payer: Self-pay

## 2014-12-09 ENCOUNTER — Other Ambulatory Visit (INDEPENDENT_AMBULATORY_CARE_PROVIDER_SITE_OTHER): Payer: Self-pay

## 2014-12-09 MED ORDER — MELOXICAM 15 MG TABLET
15.0000 mg | ORAL_TABLET | Freq: Every day | ORAL | Status: DC
Start: 2014-12-09 — End: 2015-06-17

## 2015-01-15 ENCOUNTER — Other Ambulatory Visit (INDEPENDENT_AMBULATORY_CARE_PROVIDER_SITE_OTHER): Payer: Self-pay

## 2015-02-12 ENCOUNTER — Ambulatory Visit (INDEPENDENT_AMBULATORY_CARE_PROVIDER_SITE_OTHER): Payer: MEDICAID | Admitting: Family Medicine

## 2015-02-12 ENCOUNTER — Encounter (INDEPENDENT_AMBULATORY_CARE_PROVIDER_SITE_OTHER): Payer: Self-pay | Admitting: Family Medicine

## 2015-02-12 VITALS — BP 122/68 | HR 94 | Temp 98.2°F | Resp 18 | Ht 61.0 in | Wt 113.0 lb

## 2015-02-12 DIAGNOSIS — J019 Acute sinusitis, unspecified: Principal | ICD-10-CM

## 2015-02-12 DIAGNOSIS — Z720 Tobacco use: Secondary | ICD-10-CM

## 2015-02-12 MED ORDER — AMOXICILLIN 875 MG-POTASSIUM CLAVULANATE 125 MG TABLET
1.00 | ORAL_TABLET | Freq: Two times a day (BID) | ORAL | Status: AC
Start: 2015-02-12 — End: 2015-02-19

## 2015-02-12 MED ORDER — FLUTICASONE PROPIONATE 50 MCG/ACTUATION NASAL SPRAY,SUSPENSION
1.0000 | Freq: Every day | NASAL | Status: DC
Start: 2015-02-12 — End: 2015-12-12
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
## 2015-02-12 NOTE — Progress Notes (Addendum)
Chief Complaint   Patient presents with    Cough    Congestion         SUBJECTIVE:  [name] is a 54 y.o. female here with complaints of cough, and congestion x 3 weeks. Cough with green sputum. +Headache and sinus tenderness. No fevers or chills. Patient smokes 1 pack/day. No wheezing       Patient Active Problem List   Diagnosis    Neurofibromatosis 2    COPD (chronic obstructive pulmonary disease)    Depression    Hiatal hernia    GERD (gastroesophageal reflux disease)    Arthritis    Well woman exam    Restless legs syndrome (RLS)    Tension headache, chronic    Piriformis syndrome of right side    Strain of gluteus medius of right lower extremity    Hot flash, menopausal    Hyperlipidemia    Tobacco dependence       ROS:   As per HPI  No changes in [place]    History   Smoking status    Current Every Day Smoker -- 0.75 packs/day for 35 years    Types: Cigarettes   Smokeless tobacco    Never Used       OBJECTIVE:  BP 122/68 mmHg   Pulse 94   Temp(Src) 36.8 C (98.2 F) (Oral)   Resp 18   Ht 1.549 m (5\' 1" )   Wt 51.256 kg (113 lb)   BMI 21.36 kg/m2  Physical Exam:   General: No apparent acute distress.   ENT: Boggy nasal mucosa. Maxillary and Temporal sinus tenderness.   Lungs: Clear to auscultation bilaterally.  Cardiovascular: Normal rate and regular rhythm.   Skin: Warm and dry.    ASSESSMENT/PLAN:      ICD-10-CM    1. Sinusitis, acute J01.90 fluticasone (FLONASE) 50 mcg/actuation Nasal Spray, Suspension     amoxicillin-pot clavulanate (AUGMENTIN) 875-125 mg Oral Tablet     - Will treat with Augmentin for 7 days.   - Advised to use sinus rinse  - Follow up as needed.     Charlcie Cradle, MD  02/12/2015, 14:21    I discussed the patient with the resident.  I reviewed the resident's note.  I agree with the findings and plan of care as documented in the resident's note.  Any exceptions/additions are edited/noted.    Collier Flowers, MD  03/03/2015, 13:48

## 2015-04-17 ENCOUNTER — Other Ambulatory Visit (INDEPENDENT_AMBULATORY_CARE_PROVIDER_SITE_OTHER): Payer: Self-pay

## 2015-05-20 ENCOUNTER — Other Ambulatory Visit (INDEPENDENT_AMBULATORY_CARE_PROVIDER_SITE_OTHER): Payer: Self-pay

## 2015-05-26 ENCOUNTER — Encounter (INDEPENDENT_AMBULATORY_CARE_PROVIDER_SITE_OTHER): Payer: Self-pay

## 2015-05-26 ENCOUNTER — Ambulatory Visit (INDEPENDENT_AMBULATORY_CARE_PROVIDER_SITE_OTHER): Payer: MEDICAID

## 2015-05-26 VITALS — BP 124/70 | HR 72 | Temp 98.1°F | Resp 16 | Ht 62.0 in | Wt 116.0 lb

## 2015-05-26 DIAGNOSIS — E785 Hyperlipidemia, unspecified: Secondary | ICD-10-CM

## 2015-05-26 DIAGNOSIS — M1990 Unspecified osteoarthritis, unspecified site: Secondary | ICD-10-CM

## 2015-05-26 DIAGNOSIS — Z23 Encounter for immunization: Principal | ICD-10-CM

## 2015-05-26 DIAGNOSIS — F329 Major depressive disorder, single episode, unspecified: Secondary | ICD-10-CM

## 2015-05-26 DIAGNOSIS — F32A Depression, unspecified: Secondary | ICD-10-CM

## 2015-05-26 DIAGNOSIS — K219 Gastro-esophageal reflux disease without esophagitis: Secondary | ICD-10-CM

## 2015-05-26 DIAGNOSIS — J449 Chronic obstructive pulmonary disease, unspecified: Secondary | ICD-10-CM

## 2015-05-26 DIAGNOSIS — G2581 Restless legs syndrome: Secondary | ICD-10-CM

## 2015-05-26 DIAGNOSIS — Z6821 Body mass index (BMI) 21.0-21.9, adult: Secondary | ICD-10-CM

## 2015-05-26 MED ORDER — RANITIDINE 150 MG TABLET
150.0000 mg | ORAL_TABLET | Freq: Two times a day (BID) | ORAL | Status: DC
Start: 2015-05-26 — End: 2015-09-03

## 2015-05-26 MED ORDER — ALBUTEROL SULFATE HFA 90 MCG/ACTUATION AEROSOL INHALER
INHALATION_SPRAY | RESPIRATORY_TRACT | Status: DC
Start: 2015-05-26 — End: 2016-03-14

## 2015-05-26 MED ORDER — CYCLOBENZAPRINE 5 MG TABLET
5.00 mg | ORAL_TABLET | Freq: Three times a day (TID) | ORAL | Status: DC | PRN
Start: 2015-05-26 — End: 2015-12-12

## 2015-05-26 MED ORDER — OMEPRAZOLE 40 MG CAPSULE,DELAYED RELEASE
40.0000 mg | DELAYED_RELEASE_CAPSULE | Freq: Every day | ORAL | Status: DC
Start: 2015-05-26 — End: 2015-12-12

## 2015-05-26 MED ORDER — MELOXICAM 15 MG TABLET
15.0000 mg | ORAL_TABLET | Freq: Every day | ORAL | Status: DC
Start: 2015-05-26 — End: 2015-12-12

## 2015-05-26 MED ORDER — PAROXETINE 30 MG TABLET
30.0000 mg | ORAL_TABLET | Freq: Every morning | ORAL | Status: DC
Start: 2015-05-26 — End: 2015-09-03

## 2015-05-26 NOTE — Patient Instructions (Addendum)
LEG CRAMPS - Magnesium tablet 400mg  once daily (evening if desired).  Make sure your potassium intake is adequate (bananas, dark leafy green veggies).  At least 1/2 gal of water daily (64oz).     DIFFICULTY SWALLOWING - Need to control reflux more.  Start omeprazole once daily (stop ranitidine) x 1 month.  Then go back to ranitidine twice daily (stop omeprazole). This should help minimize your inflamed esophagus.  Follow the food recommendations below.     LOW BACK PAIN - trial of heat as needed and flexeril as needed.  May take up to 10 mg of flexeril up to 3x per day.    JOINT PAIN - take Meloxicam with tylenol (max of 3,000mg  tylenol daily).            Food Choices for Gastroesophageal Reflux Disease, Adult  When you have gastroesophageal reflux disease (GERD), the foods you eat and your eating habits are very important. Choosing the right foods can help ease the discomfort of GERD.  WHAT GENERAL GUIDELINES DO I NEED TO FOLLOW?   Choose fruits, vegetables, whole grains, low-fat dairy products, and low-fat meat, fish, and poultry.   Limit fats such as oils, salad dressings, butter, nuts, and avocado.   Keep a food diary to identify foods that cause symptoms.   Avoid foods that cause reflux. These may be different for different people.   Eat frequent small meals instead of three large meals each day.   Eat your meals slowly, in a relaxed setting.   Limit fried foods.   Cook foods using methods other than frying.   Avoid drinking alcohol.   Avoid drinking large amounts of liquids with your meals.   Avoid bending over or lying down until 2-3 hours after eating.  WHAT FOODS ARE NOT RECOMMENDED?  The following are some foods and drinks that may worsen your symptoms:  Vegetables  Tomatoes. Tomato juice. Tomato and spaghetti sauce. Chili peppers. Onion and garlic. Horseradish.  Fruits  Oranges, grapefruit, and lemon (fruit and juice).  Meats  High-fat meats, fish, and poultry. This includes hot dogs, ribs,  ham, sausage, salami, and bacon.  Dairy  Whole milk and chocolate milk. Sour cream. Cream. Butter. Ice cream. Cream cheese.   Beverages  Coffee and tea, with or without caffeine. Carbonated beverages or energy drinks.  Condiments  Hot sauce. Barbecue sauce.   Sweets/Desserts  Chocolate and cocoa. Donuts. Peppermint and spearmint.  Fats and Oils  High-fat foods, including [place] fries and potato chips.  Other  Vinegar. Strong spices, such as black pepper, white pepper, red pepper, cayenne, curry powder, cloves, ginger, and chili powder.  The items listed above may not be a complete list of foods and beverages to avoid. Contact your dietitian for more information.     This information is not intended to replace advice given to you by your health care provider. Make sure you discuss any questions you have with your health care provider.     Document Released: 07/26/2005 Document Revised: 08/16/2014 Document Reviewed: 05/30/2013  Elsevier Interactive Patient Education [employer].
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
## 2015-05-26 NOTE — Progress Notes (Signed)
[facility] FOLLOW-[facility]ISIT    SUBJECTIVE:  [name] is a 54 y.o. female with a chief complaint of   Chief Complaint   Patient presents with    Medication Check     LEG CRAMPS  Bad a t night  Flexeril is helpful but she does not like to take it  Not taking any magnesium currently  Has to stand up cramps in calves are so bad    BREATHING  Has been about the same   Needs a refill on albuterol  Exercise induced wheezing - feels everything get tight and SOB, some wheezing  Using once in the AM and occasionally qhs if chest is tight (3x per week)    JOINT PAIN, LBP  Pain low back and joint pain no longer controlled with Meloxicam 15 mg    Paxil working well for depression    Did see neuro and optho for the neurofibromatosis in the past 2 yrs - nothing specific to do yet    EXAM:  BP 124/70 mmHg   Pulse 72   Temp(Src) 36.7 C (98.1 F) (Oral)   Resp 16   Ht 1.575 m (5\' 2" )   Wt 52.617 kg (116 lb)   BMI 21.21 kg/m2  GEN: A&O, in NAD  PSYCH: Appropriate mood and broad affect. Thoughts logical and goal oriented, well groomed and appropriately dressed. Moderate eye contact. Insight good  CV: RRR, s1 and s2, no murmurs  LUNGS: CTAB no w/r/r  SKIN: m,ultiple fleshy nodules scattered over entire body/face.    ASSESSMENT and PLAN:  LEG CRAMPS - Magnesium tablet 400mg  once daily (evening if desired).  Make sure your potassium intake is adequate (bananas, dark leafy green veggies).  At least 1/2 gal of water daily (64oz).     DIFFICULTY SWALLOWING - Need to control reflux more.  Start omeprazole once daily (stop ranitidine) x 1 month.  Then go back to ranitidine twice daily (stop omeprazole). This should help minimize your inflamed esophagus.  Follow the food recommendations below.     LOW BACK PAIN - trial of heat as needed and flexeril as needed.  May take up to 10 mg of flexeril up to 3x per day.    JOINT PAIN - take Meloxicam with tylenol (max of 3,000mg  tylenol daily).      COPD - may need to increase to  daily maintenance medication in the future.    Alexa Askew, DO 05/26/2015, 08:08        Visit Diagnoses and Associated Orders, Previous Medication List, Allergies -- Summary:        ICD-10-CM    1. Need for influenza vaccination Z23 INFLUENZA VACCINE IM AGE 52 THROUGH ADULT (ADMIN)           Medications:  Current Outpatient Prescriptions   Medication Sig    Betamethasone Dipropionate (MAXIVATE) 0.05 % Cream Apply topically Twice daily (Patient not taking: Reported on 05/26/2015)    cyclobenzaprine (FLEXERIL) 10 mg Oral Tablet Take 1 Tab (10 mg total) by mouth Three times a day as needed for Muscle spasms    fluticasone (FLONASE) 50 mcg/actuation Nasal Spray, Suspension 1 Spray by Each Nostril route Once a day (Patient not taking: Reported on 05/26/2015)    Meloxicam (MOBIC) 15 mg Oral Tablet Take 1 Tab (15 mg total) by mouth Once a day    MULTIVITS-MIN/IRON/FA/LUTEIN (CENTRUM SILVER WOMEN ORAL) Take 1 Tab by mouth Once a day    PARoxetine HCl (PAXIL) 30 mg  Oral Tablet Take 1 Tab (30 mg total) by mouth Every morning    PROAIR HFA 90 mcg/actuation Inhalation HFA Aerosol Inhaler INHALE 1 TO 2 PUFFS BY MOUTH EVERY 6 HOURS AS NEEDED    raNITIdine (ZANTAC) 150 mg Oral Tablet Take 1 Tab (150 mg total) by mouth Twice daily    scopolamine (TRANSDERM SCOP) 1.5 mg (1 mg over 3 days) Transdermal patch 3 day 1 Patch (1.5 mg total) by Transdermal route Every 72 hours (Patient not taking: Reported on 02/12/2015)    traMADol (ULTRAM) 50 mg Oral Tablet Take 1 Tab (50 mg total) by mouth Every 6 hours as needed (Patient not taking: Reported on 09/20/2014)        Allergies:  Allergies   Allergen Reactions    Codeine Hives/ Urticaria

## 2015-06-17 ENCOUNTER — Other Ambulatory Visit (INDEPENDENT_AMBULATORY_CARE_PROVIDER_SITE_OTHER): Payer: Self-pay

## 2015-07-24 ENCOUNTER — Other Ambulatory Visit (INDEPENDENT_AMBULATORY_CARE_PROVIDER_SITE_OTHER): Payer: Self-pay

## 2015-09-03 ENCOUNTER — Other Ambulatory Visit (INDEPENDENT_AMBULATORY_CARE_PROVIDER_SITE_OTHER): Payer: Self-pay

## 2015-09-03 DIAGNOSIS — K219 Gastro-esophageal reflux disease without esophagitis: Secondary | ICD-10-CM

## 2015-09-03 DIAGNOSIS — F329 Major depressive disorder, single episode, unspecified: Secondary | ICD-10-CM

## 2015-09-03 DIAGNOSIS — F32A Depression, unspecified: Secondary | ICD-10-CM

## 2015-09-03 MED ORDER — PAROXETINE 30 MG TABLET
30.0000 mg | ORAL_TABLET | Freq: Every morning | ORAL | 0 refills | Status: DC
Start: 2015-09-03 — End: 2015-12-03

## 2015-09-03 MED ORDER — RANITIDINE 150 MG TABLET
150.0000 mg | ORAL_TABLET | Freq: Two times a day (BID) | ORAL | 0 refills | Status: DC
Start: 2015-09-03 — End: 2015-12-12

## 2015-09-03 MED ORDER — MELOXICAM 15 MG TABLET
15.0000 mg | ORAL_TABLET | Freq: Every day | ORAL | 0 refills | Status: DC
Start: 2015-09-03 — End: 2015-12-03

## 2015-10-07 ENCOUNTER — Other Ambulatory Visit (INDEPENDENT_AMBULATORY_CARE_PROVIDER_SITE_OTHER): Payer: Self-pay

## 2015-12-03 ENCOUNTER — Other Ambulatory Visit (INDEPENDENT_AMBULATORY_CARE_PROVIDER_SITE_OTHER): Payer: Self-pay

## 2015-12-12 ENCOUNTER — Ambulatory Visit: Payer: MEDICAID

## 2015-12-12 ENCOUNTER — Encounter (INDEPENDENT_AMBULATORY_CARE_PROVIDER_SITE_OTHER): Payer: Self-pay

## 2015-12-12 ENCOUNTER — Ambulatory Visit (INDEPENDENT_AMBULATORY_CARE_PROVIDER_SITE_OTHER): Payer: MEDICAID

## 2015-12-12 VITALS — BP 124/80 | HR 80 | Temp 97.5°F | Resp 16 | Ht 62.25 in | Wt 118.6 lb

## 2015-12-12 DIAGNOSIS — R5383 Other fatigue: Secondary | ICD-10-CM

## 2015-12-12 DIAGNOSIS — R635 Abnormal weight gain: Secondary | ICD-10-CM | POA: Insufficient documentation

## 2015-12-12 DIAGNOSIS — G2581 Restless legs syndrome: Secondary | ICD-10-CM

## 2015-12-12 DIAGNOSIS — Z6821 Body mass index (BMI) 21.0-21.9, adult: Secondary | ICD-10-CM

## 2015-12-12 DIAGNOSIS — G4710 Hypersomnia, unspecified: Secondary | ICD-10-CM

## 2015-12-12 DIAGNOSIS — M1990 Unspecified osteoarthritis, unspecified site: Secondary | ICD-10-CM

## 2015-12-12 DIAGNOSIS — F17200 Nicotine dependence, unspecified, uncomplicated: Secondary | ICD-10-CM

## 2015-12-12 DIAGNOSIS — K219 Gastro-esophageal reflux disease without esophagitis: Secondary | ICD-10-CM

## 2015-12-12 LAB — COMPREHENSIVE METABOLIC PROFILE - BMC/JMC ONLY
ALBUMIN/GLOBULIN RATIO: 1.9 (ref 0.8–2.0)
ALBUMIN: 4.7 g/dL (ref 3.5–5.0)
ALKALINE PHOSPHATASE: 86 U/L (ref 38–126)
ALT (SGPT): 12 U/L — ABNORMAL LOW (ref 14–54)
ANION GAP: 9 mmol/L (ref 3–11)
AST (SGOT): 20 U/L (ref 15–41)
BILIRUBIN TOTAL: 0.1 mg/dL — ABNORMAL LOW (ref 0.3–1.2)
BUN/CREA RATIO: 26 — ABNORMAL HIGH (ref 6–22)
BUN: 20 mg/dL (ref 6–20)
CALCIUM: 9.4 mg/dL (ref 8.6–10.3)
CHLORIDE: 102 mmol/L (ref 101–111)
CO2 TOTAL: 28 mmol/L (ref 22–32)
CREATININE: 0.77 mg/dL (ref 0.44–1.00)
ESTIMATED GFR: >60 mL/min/1.73mˆ2 (ref >60)
GLUCOSE: 68 mg/dL — ABNORMAL LOW (ref 70–110)
POTASSIUM: 3.9 mmol/L (ref 3.4–5.1)
PROTEIN TOTAL: 7.2 g/dL (ref 6.4–8.3)
SODIUM: 139 mmol/L (ref 136–145)

## 2015-12-12 LAB — CBC WITH DIFF
BASOPHIL #: 0 x10ˆ3/uL (ref 0.00–0.10)
BASOPHIL %: 1 % (ref 0–3)
EOSINOPHIL #: 0.1 x10ˆ3/uL (ref 0.00–0.50)
EOSINOPHIL %: 2 % (ref 0–5)
HCT: 46 % — ABNORMAL HIGH (ref 36.0–45.0)
HGB: 15.3 g/dL (ref 12.0–15.5)
LYMPHOCYTE #: 1.9 x10ˆ3/uL (ref 1.00–4.80)
LYMPHOCYTE %: 33 % (ref 15–43)
MCH: 31.2 pg (ref 27.5–33.2)
MCHC: 33.2 g/dL (ref 32.0–36.0)
MCV: 93.8 fL (ref 82.0–97.0)
MONOCYTE #: 0.6 x10ˆ3/uL (ref 0.20–0.90)
MONOCYTE %: 11 % (ref 5–12)
MPV: 7.9 fL (ref 7.4–10.5)
NEUTROPHIL #: 3.1 x10ˆ3/uL (ref 1.50–6.50)
NEUTROPHIL %: 54 % (ref 43–76)
PLATELETS: 277 x10ˆ3/uL (ref 150–450)
RBC: 4.9 x10ˆ6/uL (ref 4.00–5.10)
RDW: 13.5 % (ref 11.0–16.0)
WBC: 5.8 x10ˆ3/uL (ref 4.0–11.0)

## 2015-12-12 LAB — FERRITIN
FERRITIN: 78 ng/mL (ref 11–307)
FERRITIN: 78 ng/mL (ref 11–307)

## 2015-12-12 LAB — VITAMIN B12: VITAMIN B 12: 230 pg/mL (ref 180–914)

## 2015-12-12 LAB — FOLATE: FOLATE: 9.4 ng/mL (ref >=4.5)

## 2015-12-12 LAB — MAGNESIUM: MAGNESIUM: 2.2 mg/dL — ABNORMAL HIGH (ref 1.4–2.1)

## 2015-12-12 LAB — THYROID STIMULATING HORMONE WITH FREE T4 REFLEX: TSH: 3.207 u[IU]/mL (ref 0.340–5.330)

## 2015-12-12 LAB — MONO TEST: MONONUCLEOSIS RAPID TEST: NEGATIVE

## 2015-12-12 LAB — HGA1C (HEMOGLOBIN A1C WITH EST AVG GLUCOSE): ESTIMATED AVERAGE GLUCOSE: 105 mg/dL (ref 70–110)

## 2015-12-12 MED ORDER — MELOXICAM 15 MG TABLET
15.0000 mg | ORAL_TABLET | Freq: Every day | ORAL | 2 refills | Status: DC
Start: 2015-12-12 — End: 2016-12-15

## 2015-12-12 MED ORDER — VARENICLINE 0.5 MG (11)-1 MG (42) TABLETS IN A DOSE PACK
ORAL_TABLET | ORAL | 2 refills | Status: DC
Start: 2015-12-12 — End: 2016-04-19

## 2015-12-12 MED ORDER — RANITIDINE 150 MG TABLET
150.0000 mg | ORAL_TABLET | Freq: Two times a day (BID) | ORAL | 2 refills | Status: DC
Start: 2015-12-12 — End: 2016-12-15

## 2015-12-12 MED ORDER — CYCLOBENZAPRINE 5 MG TABLET
5.0000 mg | ORAL_TABLET | Freq: Three times a day (TID) | ORAL | 5 refills | Status: DC | PRN
Start: 2015-12-12 — End: 2017-04-13

## 2015-12-12 MED ORDER — PAROXETINE 30 MG TABLET
30.0000 mg | ORAL_TABLET | Freq: Every morning | ORAL | 2 refills | Status: DC
Start: 2015-12-12 — End: 2016-12-15

## 2015-12-12 NOTE — Progress Notes (Signed)
[facility]   PROBLEM-ORIENTED VISIT    SUBJECTIVE:  [name] is a 55 y.o. female with a chief complaint of   Chief Complaint   Patient presents with    Wrist Pain     left     Medication Refill     Tired - sleeping 20 hrs per day  Acute onset 3 months ago  Also noted wight gain - 1kg - "all right around my middle"  Eats "everything" - veggies, meat, fruit, snacks (celery, peanut butter, nuts, 2x per week candy)  Had a sleep study - does snore - told no OSA, only RLS  Was drinking a lot of soda - cut back to 1 per day (Coke) 3 weeks ago. No improvement in sx  Now downt o 3 8oz cups int he AM of Coffee.    WRIST PAIN - LEFT  Onset 6 weeks ago - continually getting work  Works at [employer] in [employer].   Helps to wear a cockup splint but does this irregularly.   No numbness of fingers/hand    Still taking Paxil 30 mg, Meloxicam 15 mg daily, flexeril 5 mg prn, albuterol prn, zantac BID.     smokign >1ppd.  Fluctuating.  wou dlike a printed Rx for Chantix - to get for little/no cost from the manufacturer.     EXAM:  BP 124/80   Pulse 80   Temp 36.4 C (97.5 F) (Oral)    Resp 16   Ht 1.581 m (5' 2.25")   Wt 53.8 kg (118 lb 9.6 oz)   BMI 21.52 kg/m2  GEN: A&O, in NAD  PSYCH: Appropriate mood and broad affect. Thoughts logical and goal oriented, well groomed and appropriately dressed. Moderate eye contact. Insight good  MSK: TTP at flexor and extensor tendons left forearm and wrist. No ganglion cysts noted.    SKIN: fleshy tumors diffusely over body    ASSESSMENT and PLAN:  FATIGUE, WEIGHT GAIN, SOMNOLENCE - check A1C, CBC, CMP, Mono, Lymes, CMV. Mag.  If negative, consider tx the restless legs (see [facility] CAM tx too).    TENDONITIS OF FOREARM - Arnica Gel  "Arnigel" by Boiron.  Topical arnica [place] (leapards bane flower) works for pain and overwork. Can use as often as needed.  You can use this in addition to Biofreeze.     Ibuprofen up to 3,000mg  daily.  Stop  if you have any stomach discomfort.    Gentle stretches with wrist bent forward and back with arm extended (elbow straight).  Should be passive - it using your other hand or leg or wall to hold the hand in position.     Use cock-up splint nightly at least and then also at work if needed.     TOBACCO - CHANTIX Rx today.  [name], DO 12/12/2015, 12:29        Visit Diagnoses and Associated Orders, Previous Medication List, Allergies -- Summary:        ICD-10-CM    1. Hypersomnolence G47.10 HGA1C (HEMOGLOBIN A1C WITH EST AVG GLUCOSE)   2. Fatigue, unspecified type R53.83 HGA1C (HEMOGLOBIN A1C WITH EST AVG GLUCOSE)   3. Weight gain R63.5 HGA1C (HEMOGLOBIN A1C WITH EST AVG GLUCOSE)           Medications:  Current Outpatient Prescriptions   Medication Sig    albuterol sulfate (PROAIR HFA) 90 mcg/actuation Inhalation HFA Aerosol Inhaler INHALE 1 TO 2 PUFFS BY MOUTH EVERY 6  HOURS AS NEEDED    cyclobenzaprine (FLEXERIL) 5 mg Oral Tablet Take 1 Tab (5 mg total) by mouth Three times a day as needed for Muscle spasms    fluticasone (FLONASE) 50 mcg/actuation Nasal Spray, Suspension 1 Spray by Each Nostril route Once a day (Patient not taking: Reported on 05/26/2015)    Meloxicam (MOBIC) 15 mg Oral Tablet Take 1 Tab (15 mg total) by mouth Once a day (Patient not taking: Reported on 12/12/2015)    meloxicam (MOBIC) 15 mg Oral Tablet Take 1 Tab (15 mg total) by mouth Once a day (Patient not taking: Reported on 12/12/2015)    MULTIVITS-MIN/IRON/FA/LUTEIN (CENTRUM SILVER WOMEN ORAL) Take 1 Tab by mouth Once a day    Omeprazole (PRILOSEC) 40 mg Oral Capsule, Delayed Release(E.C.) Take 1 Cap (40 mg total) by mouth Once a day (Patient not taking: Reported on 12/12/2015)    PARoxetine (PAXIL) 30 mg Oral Tablet Take 1 Tab (30 mg total) by mouth Every morning    PARoxetine HCl (PAXIL) 30 mg Oral Tablet Take 1 Tab (30 mg total) by mouth Every morning    raNITIdine (ZANTAC) 150 mg Oral Tablet Take 1 Tab (150 mg total) by mouth  Twice daily    traMADol (ULTRAM) 50 mg Oral Tablet Take 1 Tab (50 mg total) by mouth Every 6 hours as needed (Patient not taking: Reported on 09/20/2014)        Allergies:  Allergies   Allergen Reactions    Codeine Hives/ Urticaria

## 2015-12-12 NOTE — Patient Instructions (Addendum)
Tendinitis  Tendinitis is swelling and inflammation of the tendons. Tendons are band-like tissues that connect muscle to bone. Tendinitis commonly occurs in the:    Shoulders (rotator cuff).   Heels (Achilles tendon).   Elbows (triceps tendon).  CAUSES  Tendinitis is usually caused by overusing the tendon, muscles, and joints involved. When the tissue surrounding a tendon (synovium) becomes inflamed, it is called tenosynovitis. Tendinitis commonly develops in people whose jobs require repetitive motions.  SYMPTOMS   Pain.   Tenderness.   Mild swelling.  DIAGNOSIS  Tendinitis is usually diagnosed by physical exam. Your health care provider may also order X-rays or other imaging tests.  TREATMENT  Your health care provider may recommend certain medicines or exercises for your treatment.  HOME CARE INSTRUCTIONS    Use a sling or splint for as long as directed by your health care provider until the pain decreases.   Put ice on the injured area.    Put ice in a plastic bag.    Place a towel between your skin and the bag.    Leave the ice on for 15-20 minutes, 3-4 times a day, or as directed by your health care provider.   Avoid using the limb while the tendon is painful. Perform gentle range of motion exercises only as directed by your health care provider. Stop exercises if pain or discomfort increase, unless directed otherwise by your health care provider.   Only take over-the-counter or prescription medicines for pain, discomfort, or fever as directed by your health care provider.  SEEK MEDICAL CARE IF:    Your pain and swelling increase.   You develop new, unexplained symptoms, especially increased numbness in the hands.  MAKE SURE YOU:    Understand these instructions.   Will watch your condition.   Will get help right away if you are not doing well or get worse.     This information is not intended to replace advice given to you by your health care provider. Make sure you discuss any questions you  have with your health care provider.     Document Released: 07/23/2000 Document Revised: 08/16/2014 Document Reviewed: 10/12/2010  Elsevier Interactive Patient Education 2016 Elsevier Inc.        Arnica Gel  "Arnigel" by [employer].  Topical arnica [place] (leapards bane flower) works for pain and overwork. Can use as often as needed.  You can use this in addition to Biofreeze.     Ibuprofen up to 3,000mg  daily.  Stop if you have any stomach discomfort.    Gentle stretches with wrist bent forward and back with arm extended (elbow straight).  Should be passive - it using your other hand or leg or wall to hold the hand in position.     Use cock-up splint nightly at least and then also at work if needed.      CHECK LABWORK FOR FATIGUE.  IF ALL IS WELL, tHEN WE WILL TRY TO TREAT THE RESTLESS LEG SYNDROME TO GET YOU SOME BETTER QUALITY REST.
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
## 2015-12-15 LAB — LYME DISEASE SEROLOGY, WITH IMMUNOBLOT REFLEX, SERUM: LYME DISEASE SEROLOGY, SERUM: NEGATIVE

## 2015-12-15 LAB — CYTOMEGALOVIRUS (CMV) ANTIBODIES, IGM, SERUM: CYTOMEGALOVIRUS AB, IGM: NEGATIVE

## 2015-12-16 LAB — VITAMIN D, SERUM (25 HYDROXYVITAMIN D2 AND D3 BY MS)
25-HYDROXYVIT D2/D3,TOTAL: 20.4 ng/mL (ref 20.0–150.0)
25-HYDROXYVITAMIN D2: <4 ng/mL
25-HYDROXYVITAMIN D3: 20.4 ng/mL

## 2016-01-13 ENCOUNTER — Other Ambulatory Visit (INDEPENDENT_AMBULATORY_CARE_PROVIDER_SITE_OTHER): Payer: Self-pay

## 2016-01-13 MED ORDER — VARENICLINE 1 MG TABLET
1.00 mg | ORAL_TABLET | Freq: Two times a day (BID) | ORAL | 0 refills | Status: AC
Start: 2016-01-13 — End: 2016-04-12

## 2016-01-13 NOTE — Telephone Encounter (Signed)
Received phone call from [place] with [employer] [phone]) regarding patient's Chantix rx.  She stated that they can not just send out the starter pack, the continuing pack is also needed (med pending).    Please advise.    Otelia Santee, [place]  01/13/2016, 10:51

## 2016-01-14 NOTE — Telephone Encounter (Signed)
Verbal given to [place] for continuation pack.    [name], [place]  01/14/2016, 09:12

## 2016-02-12 ENCOUNTER — Encounter (INDEPENDENT_AMBULATORY_CARE_PROVIDER_SITE_OTHER): Payer: Self-pay

## 2016-02-12 NOTE — Progress Notes (Signed)
Patient notified that Chantix at front desk of [facility], [place]  02/12/2016, 11:13

## 2016-03-01 ENCOUNTER — Ambulatory Visit: Payer: MEDICAID

## 2016-03-01 ENCOUNTER — Encounter (INDEPENDENT_AMBULATORY_CARE_PROVIDER_SITE_OTHER): Payer: Self-pay

## 2016-03-01 ENCOUNTER — Ambulatory Visit (INDEPENDENT_AMBULATORY_CARE_PROVIDER_SITE_OTHER): Payer: MEDICAID

## 2016-03-01 VITALS — BP 106/58 | HR 68 | Temp 97.2°F | Resp 16 | Ht 61.25 in | Wt 116.0 lb

## 2016-03-01 DIAGNOSIS — R5383 Other fatigue: Principal | ICD-10-CM | POA: Insufficient documentation

## 2016-03-01 DIAGNOSIS — G2581 Restless legs syndrome: Secondary | ICD-10-CM

## 2016-03-01 LAB — LIPID PANEL
CHOL/HDL RATIO: 4.5
CHOLESTEROL: 288 mg/dL — ABNORMAL HIGH (ref 120–199)
HDL CHOL: 64 mg/dL (ref 45–65)
LDL CALC: 174 mg/dL — ABNORMAL HIGH (ref <=130)
TRIGLYCERIDES: 248 mg/dL — ABNORMAL HIGH (ref 35–135)
VLDL CALC: 50 mg/dL — ABNORMAL HIGH (ref 5–35)

## 2016-03-01 LAB — CORTISOL, PLASMA OR SERUM: CORTISOL: 6.6 ug/dL

## 2016-03-01 MED ORDER — ROPINIROLE 0.25 MG TABLET
0.25 mg | ORAL_TABLET | Freq: Every evening | ORAL | 5 refills | Status: DC
Start: 2016-03-01 — End: 2018-02-24

## 2016-03-01 NOTE — Patient Instructions (Signed)
RESTLESS LEG SYNDROME - take Requip 1-3 hours before bedtime 0.25mg .  If after 5 nights not working well enough, can increase to 2 tablets.  Drink  AT LEAST 1/2 gal water daily.    Stay away from sweets and too many "white things" - pasta, bread, potatoes.     Great job with quitting smoking!

## 2016-03-01 NOTE — Progress Notes (Signed)
[facility]   PROBLEM-ORIENTED VISIT    SUBJECTIVE:  [name] is a 55 y.o. female with a chief complaint of   Chief Complaint   Patient presents with    Needs Order     FATIGUE  Has been sleeping a lot still  Used to drink a whole pot of coffee per day - now down to 1-3 8oz cups per day  Only drinks maybe 1 soda per day now  Still taking Paxil 30 mg, Meloxicam 15 mg daily, flexeril 5 mg prn, albuterol prn, zantac BID.   Also taking Chantix  No significant change in sx  Has been on B12 and D3 for 2 mo now as directed.   Has been having some more muscle cramps lately  Had a sleep study in the past - showed RLS  Last Mag level 2.2 in May.    "sip on water all day long"  Sick on stomach if I eat something sweet.   Has really cut down on sweets and simple carbs    SMOKING  Just about quit - only 3 cigs yesterday and none yet today  Using Chantix =- getting weird dreams but doesn't bother      EXAM:  BP (!) 106/58   Pulse 68   Temp 36.2 C (97.2 F) (Oral)    Resp 16   Ht 1.556 m (5' 1.25")   Wt 52.6 kg (116 lb)   BMI 21.74 kg/m2  GEN: A&O, in NAD  PSYCH: Appropriate mood and broad affect. Thoughts logical and goal oriented, well groomed and appropriately dressed. Moderate eye contact. Insight good  CV: RRR, s1 and s2, no murmurs  LUNGS: CTAB no w/r/r  SKIN: fibromas diffusely.    ASSESSMENT and PLAN:  RESTLESS LEG SYNDROME - take Requip 1-3 hours before bedtime 0.25mg .  If after 5 nights not working well enough, can increase to 2 tablets.  Drink  AT LEAST 1/2 gal water daily    Great job with quitting smoking!    FATIGUE =- check below labs.  If still no answer, consider referral to endocrine or neuro.  Stay away from sweets and too many "white things" - pasta, bread, potatoes.     Alexa Askew, DO 03/01/2016, 09:02        Visit Diagnoses and Associated Orders, Previous Medication List, Allergies -- Summary:        ICD-10-CM    1. Fatigue, unspecified type R53.83 LIPID PANEL     CORTISOL, PLASMA  OR SERUM     VITAMIN B12     FOLATE     METHYLMALONIC ACID (MMA), QUANTITATIVE, SERUM     HOMOCYSTEINE, TOTAL, PLASMA     VITAMIN B1 LEVEL - [facility] ONLY           Medications:  Current Outpatient Prescriptions   Medication Sig    albuterol sulfate (PROAIR HFA) 90 mcg/actuation Inhalation HFA Aerosol Inhaler INHALE 1 TO 2 PUFFS BY MOUTH EVERY 6 HOURS AS NEEDED    cholecalciferol, vitamin D3, 1,000 unit Oral Tablet Take 1,000 Units by mouth Once a day    cyanocobalamin (VITAMIN B 12) 1,000 mcg Oral Tablet Take 1,000 mcg by mouth Once a day    cyclobenzaprine (FLEXERIL) 5 mg Oral Tablet Take 1 Tab (5 mg total) by mouth Three times a day as needed for Muscle spasms    meloxicam (MOBIC) 15 mg Oral Tablet Take 1 Tab (15 mg total) by mouth Once a day  MULTIVITS-MIN/IRON/FA/LUTEIN (CENTRUM SILVER WOMEN ORAL) Take 1 Tab by mouth Once a day    PARoxetine (PAXIL) 30 mg Oral Tablet Take 1 Tab (30 mg total) by mouth Every morning    raNITIdine (ZANTAC) 150 mg Oral Tablet Take 1 Tab (150 mg total) by mouth Twice daily    varenicline (CHANTIX) 1 mg Oral Tablet Take 1 Tab (1 mg total) by mouth Twice daily for 90 days    varenicline (CHANTIX) dose pak Days 1 to 3: 0.5 mg once daily  Days 4 to 7: 0.5 mg twice daily  Maintenance (= Day 8): 1 mg twice daily for 11 weeks        Allergies:  Allergies   Allergen Reactions    Codeine Hives/ Urticaria
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
## 2016-03-02 LAB — FOLATE: FOLATE: 7.9 ng/mL (ref >=4.5)

## 2016-03-02 LAB — VITAMIN B12: VITAMIN B 12: 540 pg/mL (ref 180–914)

## 2016-03-03 LAB — HOMOCYSTEINE, TOTAL, PLASMA: HOMOCYSTEINE: 10.2 umol/L (ref 3.00–11.00)

## 2016-03-03 LAB — METHYLMALONIC ACID (MMA), QUANTITATIVE, SERUM: METHYLMALONIC ACID (MMA), QUANTITATIVE, SERUM: 0.18 nmol/mL (ref <=0.40)

## 2016-03-05 LAB — VITAMIN B1 LEVEL: VITAMIN B1, LCMSMS: 8 nmol/L (ref 8–30)

## 2016-03-14 ENCOUNTER — Other Ambulatory Visit (INDEPENDENT_AMBULATORY_CARE_PROVIDER_SITE_OTHER): Payer: Self-pay

## 2016-03-18 ENCOUNTER — Encounter (INDEPENDENT_AMBULATORY_CARE_PROVIDER_SITE_OTHER): Payer: Self-pay

## 2016-03-18 DIAGNOSIS — R5382 Chronic fatigue, unspecified: Secondary | ICD-10-CM

## 2016-03-25 DIAGNOSIS — R5383 Other fatigue: Secondary | ICD-10-CM | POA: Insufficient documentation

## 2016-03-29 ENCOUNTER — Ambulatory Visit: Payer: MEDICAID

## 2016-03-29 DIAGNOSIS — R5382 Chronic fatigue, unspecified: Principal | ICD-10-CM | POA: Insufficient documentation

## 2016-03-29 LAB — CORTISOL, PLASMA OR SERUM: CORTISOL: 8.7 ug/dL

## 2016-04-01 ENCOUNTER — Encounter (INDEPENDENT_AMBULATORY_CARE_PROVIDER_SITE_OTHER): Payer: Self-pay

## 2016-04-01 DIAGNOSIS — E2740 Unspecified adrenocortical insufficiency: Secondary | ICD-10-CM | POA: Insufficient documentation

## 2016-04-01 DIAGNOSIS — R7989 Other specified abnormal findings of blood chemistry: Secondary | ICD-10-CM | POA: Insufficient documentation

## 2016-04-19 ENCOUNTER — Encounter (INDEPENDENT_AMBULATORY_CARE_PROVIDER_SITE_OTHER): Payer: Self-pay

## 2016-04-19 ENCOUNTER — Ambulatory Visit (INDEPENDENT_AMBULATORY_CARE_PROVIDER_SITE_OTHER): Payer: MEDICAID

## 2016-04-19 VITALS — BP 118/76 | HR 68 | Temp 97.8°F | Resp 14 | Ht 62.0 in | Wt 115.0 lb

## 2016-04-19 DIAGNOSIS — Z0000 Encounter for general adult medical examination without abnormal findings: Secondary | ICD-10-CM

## 2016-04-19 DIAGNOSIS — Z720 Tobacco use: Secondary | ICD-10-CM

## 2016-04-19 DIAGNOSIS — E2740 Unspecified adrenocortical insufficiency: Secondary | ICD-10-CM

## 2016-04-19 DIAGNOSIS — F17201 Nicotine dependence, unspecified, in remission: Secondary | ICD-10-CM

## 2016-04-19 DIAGNOSIS — R7989 Other specified abnormal findings of blood chemistry: Secondary | ICD-10-CM

## 2016-04-19 DIAGNOSIS — F17200 Nicotine dependence, unspecified, uncomplicated: Secondary | ICD-10-CM

## 2016-04-19 DIAGNOSIS — IMO0001 Reserved for inherently not codable concepts without codable children: Secondary | ICD-10-CM

## 2016-04-19 DIAGNOSIS — J449 Chronic obstructive pulmonary disease, unspecified: Secondary | ICD-10-CM

## 2016-04-19 DIAGNOSIS — Z23 Encounter for immunization: Secondary | ICD-10-CM

## 2016-04-19 DIAGNOSIS — E785 Hyperlipidemia, unspecified: Principal | ICD-10-CM

## 2016-04-19 MED ORDER — VARENICLINE TARTRATE 0.5 MG (11)-1 MG (42) TABLETS IN A DOSE PACK
ORAL_TABLET | ORAL | 2 refills | Status: DC
Start: 2016-04-19 — End: 2018-02-24

## 2016-04-19 NOTE — Progress Notes (Signed)
[facility]   PROBLEM-ORIENTED VISIT    SUBJECTIVE:  [name] is a 55 y.o. female with a chief complaint of   Chief Complaint   Patient presents with    Lab Results   FATIGUE, HIGH TRIGS  Has been avoiding carbs  Down to 3 sodas per week (rather than 12 pack).   No potatoes.  Occasional small amts of pasta.   No processed cheese.  Eating chedder, provolone.  Nothing fried.  Doing this now for 2 mo.   Also taking garlic and fish oil/omega 3 nightly    HEALTH MAINTENANCE  Last Tdap unknown  recent c-scope    COPD  Needs Pneumovax        EXAM:  BP 118/76   Pulse 68   Temp 36.6 C (97.8 F) (Oral)    Resp 14   Ht 1.575 m (5\' 2" )   Wt 52.2 kg (115 lb)   BMI 21.03 kg/m2  GEN: A&O, in NAD  PSYCH: Appropriate mood and broad affect. Thoughts logical and goal oriented, well groomed and appropriately dressed. Moderate eye contact. Insight good    ASCVD risk calculator 10 yr - currently at 2.6% (increased to >6% if still smoking).       ASSESSMENT and PLAN:  HM - Hep c testing, Tdap, Flu and Pneumovax today.  Will need Prevnar age 26 (and a repeat     FATIGUE, ADRENAL INSUFFICIENCY - low carb healthy fat diet - continue and recheck levels FLP and A1C in 1 mo (total 3 mo since).  Given info from [employer].article on Lancet publication.   CORTISOL (LOW) - check level agaiin in1 mo - see if still increasing 2/2 lifestyle changes    Great job on quitting smoking!  [name], DO 04/19/2016, 18:19        Visit Diagnoses and Associated Orders, Previous Medication List, Allergies -- Summary:        ICD-10-CM    1. Dyslipidemia E78.5 LIPID PANEL     HGA1C (HEMOGLOBIN A1C WITH EST AVG GLUCOSE)   2. Need for vaccination Z23 Pneumovax (Admin)     Tdap Vaccine - Adacel (Admin)     Influenza Vaccine IM 3 years through Adult (Admin)     CANCELED: Influenza Vaccine,  High Dose IM (Admin)   3. 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 Tobacco dependence F17.200 varenicline (CHANTIX) dose pak   6. Well adult IMO0001 HEPATITIS C ANTIBODY   7. Low serum cortisol level ([facility]) E27.40 CORTISOL, PLASMA OR SERUM           Medications:  Current Outpatient Prescriptions   Medication Sig    albuterol sulfate (PROAIR HFA) 90 mcg/actuation Inhalation HFA Aerosol Inhaler Take 1-2 Puffs by inhalation Every 6 hours as needed    cholecalciferol, vitamin D3, 1,000 unit Oral Tablet Take 1,000 Units by mouth Once a day    cyanocobalamin (VITAMIN B 12) 1,000 mcg Oral Tablet Take 1,000 mcg by mouth Once a day    cyclobenzaprine (FLEXERIL) 5 mg Oral Tablet Take 1 Tab (5 mg total) by mouth Three times a day as needed for Muscle spasms (Patient not taking: Reported on 0000000)    Garlic Oral Capsule Take by mouth Once a day    meloxicam (MOBIC) 15 mg Oral Tablet Take 1 Tab (15 mg total) by mouth Once a day  MULTIVITS-MIN/IRON/FA/LUTEIN (CENTRUM SILVER WOMEN ORAL) Take 1 Tab by mouth Once a day    OMEGA-3S/DHA/EPA/FISH OIL (OMEGA 3 ORAL) Take by mouth Once a day    PARoxetine (PAXIL) 30 mg Oral Tablet Take 1 Tab (30 mg total) by mouth Every morning    raNITIdine (ZANTAC) 150 mg Oral Tablet Take 1 Tab (150 mg total) by mouth Twice daily    rOPINIRole (REQUIP) 0.25 mg Oral Tablet Take 1 Tab (0.25 mg total) by mouth Every night (Patient not taking: Reported on 04/19/2016)    varenicline (CHANTIX) dose pak 1 mg twice daily for 11 weeks        Allergies:  Allergies   Allergen Reactions    Codeine Hives/ Urticaria

## 2016-04-19 NOTE — Patient Instructions (Signed)
Fasting labwork - have it drawn no sooner than 2nd week in October.  Fasting x 12 hours.  Water and black coffee are ok (no cream or sugar).  Can have drawn at [facility] or [place].  Lab orders are in the computer.  Will recheck cholesterol and average blood sugar and cortisol. Have it drawn at 8am.

## 2016-05-31 ENCOUNTER — Ambulatory Visit: Payer: MEDICAID

## 2016-05-31 DIAGNOSIS — E785 Hyperlipidemia, unspecified: Secondary | ICD-10-CM

## 2016-05-31 DIAGNOSIS — E2740 Unspecified adrenocortical insufficiency: Secondary | ICD-10-CM | POA: Insufficient documentation

## 2016-05-31 DIAGNOSIS — R7989 Other specified abnormal findings of blood chemistry: Secondary | ICD-10-CM

## 2016-05-31 DIAGNOSIS — IMO0001 Reserved for inherently not codable concepts without codable children: Secondary | ICD-10-CM

## 2016-05-31 LAB — LIPID PANEL
CHOL/HDL RATIO: 3.4
CHOLESTEROL: 229 mg/dL — ABNORMAL HIGH (ref 120–199)
HDL CHOL: 67 mg/dL — ABNORMAL HIGH (ref 45–65)
LDL CALC: 138 mg/dL — ABNORMAL HIGH (ref <=130)
TRIGLYCERIDES: 121 mg/dL (ref 35–135)
TRIGLYCERIDES: 121 mg/dL (ref 35–135)
VLDL CALC: 24 mg/dL (ref 5–35)

## 2016-05-31 LAB — HGA1C (HEMOGLOBIN A1C WITH EST AVG GLUCOSE)
ESTIMATED AVERAGE GLUCOSE: 108 mg/dL (ref 70–110)
GLYCOHEMOGLOBIN (HBA1C): 5.4 % (ref 4.0–6.0)

## 2016-05-31 LAB — CORTISOL, PLASMA OR SERUM: CORTISOL: 23 ug/dL

## 2016-05-31 LAB — HEPATITIS C ANTIBODY: HCV ANTIBODY QUALITATIVE: NEGATIVE

## 2016-06-04 ENCOUNTER — Encounter (INDEPENDENT_AMBULATORY_CARE_PROVIDER_SITE_OTHER): Payer: Self-pay

## 2016-12-15 ENCOUNTER — Other Ambulatory Visit (INDEPENDENT_AMBULATORY_CARE_PROVIDER_SITE_OTHER): Payer: Self-pay

## 2017-01-11 ENCOUNTER — Other Ambulatory Visit (INDEPENDENT_AMBULATORY_CARE_PROVIDER_SITE_OTHER): Payer: Self-pay

## 2017-01-12 ENCOUNTER — Other Ambulatory Visit (INDEPENDENT_AMBULATORY_CARE_PROVIDER_SITE_OTHER): Payer: Self-pay

## 2017-04-13 ENCOUNTER — Other Ambulatory Visit (INDEPENDENT_AMBULATORY_CARE_PROVIDER_SITE_OTHER): Payer: Self-pay

## 2017-04-13 DIAGNOSIS — G2581 Restless legs syndrome: Secondary | ICD-10-CM

## 2017-04-13 MED ORDER — CYCLOBENZAPRINE 5 MG TABLET: 5 mg | Tab | Freq: Three times a day (TID) | ORAL | 5 refills | 0 days | Status: DC | PRN

## 2017-04-13 MED ORDER — RANITIDINE 150 MG TABLET: 150 mg | Tab | Freq: Two times a day (BID) | ORAL | 1 refills | 0 days | Status: DC

## 2017-04-13 MED ORDER — MELOXICAM 15 MG TABLET
15.0000 mg | ORAL_TABLET | Freq: Every day | ORAL | 0 refills | Status: DC
Start: 2017-04-13 — End: 2017-08-25

## 2017-04-13 MED ORDER — PAROXETINE 30 MG TABLET: 30 mg | Tab | Freq: Every morning | ORAL | 0 refills | 0 days | Status: DC

## 2017-04-13 NOTE — Telephone Encounter (Signed)
From: [name]  To: [name], DO  Sent: 04/13/2017 4:36 PM EDT  Subject: Medication Renewal Request    Original authorizing provider: [name], DO    [name] would like a refill of the following medications:  cyclobenzaprine (FLEXERIL) 5 mg Oral Tablet [name], DO]  meloxicam (MOBIC) 15 mg Oral Tablet [name], DO]  PARoxetine (PAXIL) 30 mg Oral Tablet [name], DO]  raNITIdine (ZANTAC) 150 mg Oral Tablet [name], DO]    Preferred pharmacy: [id number] - [place], [place] - [address] AT [place]:

## 2017-05-17 ENCOUNTER — Encounter (INDEPENDENT_AMBULATORY_CARE_PROVIDER_SITE_OTHER): Payer: Self-pay

## 2017-05-30 ENCOUNTER — Encounter (INDEPENDENT_AMBULATORY_CARE_PROVIDER_SITE_OTHER): Payer: Self-pay

## 2017-07-08 ENCOUNTER — Encounter (INDEPENDENT_AMBULATORY_CARE_PROVIDER_SITE_OTHER): Payer: Self-pay

## 2017-07-19 ENCOUNTER — Other Ambulatory Visit (INDEPENDENT_AMBULATORY_CARE_PROVIDER_SITE_OTHER): Payer: Self-pay

## 2017-08-18 ENCOUNTER — Encounter (INDEPENDENT_AMBULATORY_CARE_PROVIDER_SITE_OTHER): Payer: Self-pay

## 2017-08-18 ENCOUNTER — Ambulatory Visit (INDEPENDENT_AMBULATORY_CARE_PROVIDER_SITE_OTHER): Payer: MEDICAID

## 2017-08-18 VITALS — BP 111/68 | HR 84 | Temp 97.0°F | Resp 16 | Ht 62.0 in | Wt 114.4 lb

## 2017-08-18 DIAGNOSIS — Z720 Tobacco use: Secondary | ICD-10-CM

## 2017-08-18 DIAGNOSIS — F329 Major depressive disorder, single episode, unspecified: Secondary | ICD-10-CM

## 2017-08-18 DIAGNOSIS — F32A Depression, unspecified: Secondary | ICD-10-CM

## 2017-08-18 DIAGNOSIS — K219 Gastro-esophageal reflux disease without esophagitis: Secondary | ICD-10-CM

## 2017-08-18 DIAGNOSIS — F419 Anxiety disorder, unspecified: Secondary | ICD-10-CM

## 2017-08-18 DIAGNOSIS — Z23 Encounter for immunization: Secondary | ICD-10-CM

## 2017-08-18 DIAGNOSIS — R1310 Dysphagia, unspecified: Secondary | ICD-10-CM

## 2017-08-18 DIAGNOSIS — K449 Diaphragmatic hernia without obstruction or gangrene: Secondary | ICD-10-CM

## 2017-08-18 DIAGNOSIS — Q8502 Neurofibromatosis, type 2: Secondary | ICD-10-CM

## 2017-08-18 MED ORDER — PAROXETINE 40 MG TABLET
ORAL_TABLET | ORAL | 0 refills | Status: DC
Start: 2017-08-18 — End: 2017-11-11

## 2017-08-18 NOTE — Progress Notes (Addendum)
[facility]  [place] [address]  Dept: [phone]  Dept Fax: [phone]  Loc: [phone]  Loc Fax: [phone]  Patient: [name]  MRN: [id number]  Date of Birth: 31-Mar-1961  Date of Service: 08/18/2017    Chief Complaint: Dysphagia (x 6 months) and Flu Vaccine      HPI: [name] is a 57 y.o. female presenting today with a 6 month history of dysphagia.  Patient c/o dysphagia to liquids and solids.  She states if she drinks her liquids too fast they get stuck in her mid esophagus.  She is unable to induce vomiting to alleviate the discomfort.  She also complains of dysphagia to solids.  She struggles with eating textured protein, eggs, and leafy greens.  She admits to a history of hiatal hernia but was told by her gastroenterologist that it cannot be repaired.  +CP with food sticking, coughing.  She denies abdominal pain, nausea, vomiting. Patient has a history of neurofibromatosis.  She had a colonoscopy, but  she has never had an endoscopy.  She also requests an influenza vaccine today.    She also complains of increase anger and agitation recently.  She has a longstanding history of depression and anxiety treated with Paxil.  She would like to increase her dose of Paxil.  She denies HI, SI, crying, impulsivity.      Past Medical History:  Past Medical History:   Diagnosis Date   . Anesthesia complication     O2 level dropped   . Chronic obstructive pulmonary disease    . Depression    . Neurofibromatosis (CMS [place])    . Shortness of breath            Past Surgical History:  Past Surgical History:   Procedure Laterality Date   . HX CHOLECYSTECTOMY  2011   . HX EYE SURGERY      Eye lid reduction   . HX HYSTERECTOMY  07/2011   . 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 Marital status: Legally Separated     Spouse name: Not on file   . Number of children: Not on file   . Years of education: Not on file   . Highest education level: Not on file   Social Needs   . Financial resource strain: Not on file   . Food insecurity - worry: Not on file   . Food insecurity - inability: Not on file   . Transportation needs - medical: Not on file   . Transportation needs - non-medical: Not on file   Occupational History   . Not on file   Tobacco Use   . Smoking status: Current Every Day Smoker     Packs/day: 1.00     Years: 35.00  Pack years: 35.00     Types: Cigarettes     Last attempt to quit: 03/19/2016     Years since quitting: 1.4   . Smokeless tobacco: Never Used   Substance and Sexual Activity   . Alcohol use: No     Alcohol/week: 0.0 oz   . Drug use: No   . Sexual activity: Never   Other Topics Concern   . Ability to Walk 1 Flight of Steps without SOB/CP Yes   . Routine Exercise Not Asked   . Ability to Walk 2 Flight of Steps without SOB/CP No     Comment: sob   . Unable to Ambulate Not Asked   . Total Care Not Asked   . Ability To Do Own ADL's Yes   . Uses Walker Not Asked   . Other Activity Level Not Asked   . Uses Cane Not Asked   . Abuse/Domestic Violence Not Asked   . Breast Self Exam Not Asked   . Caffeine Concern Yes   . Calcium intake adequate Not Asked   . Computer Use Not Asked   . Drives Not Asked   . Exercise Concern Not Asked   . Helmet Use Not Asked   . Seat Belt Not Asked   . Special Diet Not Asked   . Sunscreen used Not Asked   . Uses Cane Not Asked   . Uses walker Not Asked   . Uses wheelchair Not Asked   . Right hand dominant Yes   . Left hand dominant Not Asked   . Ambidextrous Not Asked   . Shift Work Not Asked     . Unusual Sleep-Wake Schedule Not Asked   Social History Narrative   . Not on file       Allergies:  Allergies   Allergen Reactions   . Codeine Hives/ Urticaria       Medications:  Current Outpatient Medications   Medication Sig   . albuterol sulfate (PROAIR HFA) 90 mcg/actuation Inhalation HFA Aerosol Inhaler Take 1-2 Puffs by inhalation Every 6 hours as needed   . cholecalciferol, vitamin D3, 1,000 unit Oral Tablet Take 1,000 Units by mouth Once a day   . cyanocobalamin (VITAMIN B 12) 1,000 mcg Oral Tablet Take 1,000 mcg by mouth Once a day   . cyclobenzaprine (FLEXERIL) 5 mg Oral Tablet Take 1 Tab (5 mg total) by mouth Three times a day as needed for Muscle spasms   . Garlic Oral Capsule Take by mouth Once a day   . meloxicam (MOBIC) 15 mg Oral Tablet Take 1 Tab (15 mg total) by mouth Once a day   . MULTIVITS-MIN/IRON/FA/LUTEIN (CENTRUM SILVER WOMEN ORAL) Take 1 Tab by mouth Once a day   . OMEGA-3S/DHA/EPA/FISH OIL (OMEGA 3 ORAL) Take by mouth Once a day   . PARoxetine (PAXIL) 40 mg Oral Tablet TAKE 1 TABLET(40 MG) BY MOUTH ONCE DAILY   . raNITIdine (ZANTAC) 150 mg Oral Tablet Take 1 Tab (150 mg total) by mouth Twice daily   . rOPINIRole (REQUIP) 0.25 mg Oral Tablet Take 1 Tab (0.25 mg total) by mouth Every night (Patient not taking: Reported on 04/19/2016)   . varenicline (CHANTIX) dose pak 1 mg twice daily for 11 weeks (Patient not taking: Reported on 08/18/2017)       Problem List:  Patient Active Problem List   Diagnosis   . 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 COPD (chronic obstructive pulmonary disease) (CMS [facility])   .  Depression   . Hiatal hernia   . GERD (gastroesophageal reflux disease)   . Arthritis   . Well woman exam   . Restless legs syndrome (RLS)   . Tension headache, chronic   . Piriformis syndrome of right side   . Strain of gluteus medius of right lower extremity   . Hot flash, menopausal   . Hyperlipidemia   . Fatigue   . Low serum cortisol level (CMS [facility])       Review of Systems:  Constitutional:  negative for fevers, chills and weight loss  Eyes: negative for visual disturbance  Ears, nose, mouth, throat, and face: negative for hearing loss, earaches and hoarseness  Respiratory: negative for cough or dyspnea on exertion  Cardiovascular: negative for chest pressure/discomfort, palpitations, near-syncope, syncope  Gastrointestinal: See HPI  Genitourinary:negative for frequency, dysuria and hematuria. No sexual concerns or vaginal irritation   Integument/breast: negative for rash and changed mole  Hematologic/lymphatic: negative for lymphadenopathy  Musculoskeletal:negative for myalgias, arthralgias and muscle weakness  Neurological: negative for seizures and stroke  or TIA symptoms  Behavioral/Psych:  PHQ2 completed  Endocrine: negative for diabetic symptoms including polyuria, polydipsia and weight loss  Allergic/Immunologic: negative    Exam:  General Vitals: BP 111/68   Pulse 84   Temp 36.1 C (97 F) (Oral)   Resp 16   Ht 1.575 m (5\' 2" )   Wt 51.9 kg (114 lb 6.4 oz)   BMI 20.92 kg/m   General: appears in good health, appears older than stated age and no distress  Eyes: Pupils equal and round, reactive to light and accomodation. , Conjunctivae/corneas clear, EOM's intact.    HENT  Nose without erythema. , Mouth mucous membranes moist , Pharynx without injection or exudate. , No oral lesions.   Neck: No JVD or thyromegaly or lymphadenopathy and thyroid: not enlarged, symmetric, no tenderness/mass/nodules  Lungs: clear to auscultation bilaterally.   Cardiovascular:    Heart regular rate and rhythm, S1, S2 normal, no murmur, click, rub or gallop  Abdomen: soft, non-tender, non-distended, no hepatosplenomegaly, no masses and no hernias  Skin: Skin color, texture, turgor normal. Diffuse fibromas.  Neurologic:   alert and oriented x3  Lymphatics: no lymphadenopathy  Psychiatric: affect normal, behavior normal, thought content normal, judgement normal     Assessment and Plan:    ICD-10-CM    1. Dysphagia  R13.10 FLUORO ESOPHAGRAM (BA SWALLOW)     Refer to Speech & Swallowing-External for swallowing eval, all phases.       THYROID STIMULATING HORMONE WITH FREE T4 REFLEX     Refer to External Provider-gastroenterology for endoscopy and Hiatal hernia consult  Soft mechanical and thick liquids discussed  Eat slowly, chew food thoroughly  Avoid large portions  Promotility agents, Domerpidone, Reglan discussed but not prescribed until after testing and GI consult complete.     2. Influenza vaccine needed Z23 Influenza Vaccine IM Age 37mo through Adult 0.5ML(admin)   3. Anxiety and depression F41.9 PARoxetine (PAXIL) 40 mg Oral Tablet, take 1 tab po once daily, (will increase her dose from 30 mg to 40 mg once daily)  Adverse effects of the medication are discussed    F32.9    4. 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 Hiatal hernia K44.9 Refer to External Provider-gastroenterology for endoscopy and discussion of possible Nissen Fundoplication if needed   6. GERD (gastroesophageal reflux disease) K21.9 Refer to External Provider-gastroenterology  No change to current  medications  See plan above               Return in about 1 month (around 09/18/2017), or if symptoms worsen or fail to improve, for Symptom Recheck.    Fabio Pierce, PA  Collaborative/Supervising physician Kym Groom, MD

## 2017-08-18 NOTE — Nursing Note (Signed)
Chief Complaint:   Dysphagia (x 6 months) and Flu Vaccine    Functional Health Screen        Vital Signs  BP 111/68   Pulse 84   Temp 36.1 C (97 F) (Oral)   Resp 16   Ht 1.575 m (5\' 2" )   Wt 51.9 kg (114 lb 6.4 oz)   BMI 20.92 kg/m       Social History     Tobacco Use   Smoking Status Current Every Day Smoker   . Packs/day: 1.00   . Years: 35.00   . Pack years: 35.00   . Types: Cigarettes   . Last attempt to quit: 03/19/2016   . Years since quitting: 1.4   Smokeless Tobacco Never Used     Allergies  Allergies   Allergen Reactions   . Codeine Hives/ Urticaria     Medication History  Reviewed for OTC medication and any new medications, provider will review medication history  Care Team  Patient Care Team:  [name], DO as PCP - General ([facility])  Immunizations - last 24 hours     None        There are no exam notes on file for this visit.  Golden Hurter, LPN  9/62/9528, 41:32

## 2017-08-25 ENCOUNTER — Other Ambulatory Visit (INDEPENDENT_AMBULATORY_CARE_PROVIDER_SITE_OTHER): Payer: Self-pay

## 2017-08-25 DIAGNOSIS — G2581 Restless legs syndrome: Secondary | ICD-10-CM

## 2017-08-25 MED ORDER — RANITIDINE 150 MG TABLET
150.00 mg | ORAL_TABLET | Freq: Two times a day (BID) | ORAL | 1 refills | Status: DC
Start: 2017-08-25 — End: 2018-02-24

## 2017-08-25 MED ORDER — MELOXICAM 15 MG TABLET
15.0000 mg | ORAL_TABLET | Freq: Every day | ORAL | 0 refills | Status: DC
Start: 2017-08-25 — End: 2017-11-10

## 2017-08-25 MED ORDER — CYCLOBENZAPRINE 5 MG TABLET
5.00 mg | ORAL_TABLET | Freq: Three times a day (TID) | ORAL | 5 refills | Status: DC | PRN
Start: 2017-08-25 — End: 2018-02-24

## 2017-08-26 ENCOUNTER — Telehealth (INDEPENDENT_AMBULATORY_CARE_PROVIDER_SITE_OTHER): Payer: Self-pay

## 2017-08-26 ENCOUNTER — Other Ambulatory Visit (INDEPENDENT_AMBULATORY_CARE_PROVIDER_SITE_OTHER): Payer: Self-pay

## 2017-08-26 ENCOUNTER — Encounter (INDEPENDENT_AMBULATORY_CARE_PROVIDER_SITE_OTHER): Payer: Self-pay

## 2017-08-26 DIAGNOSIS — Q8501 Neurofibromatosis, type 1: Secondary | ICD-10-CM

## 2017-08-26 NOTE — Telephone Encounter (Signed)
Thank you for the note.  I placed the order as you requested.  Please let the patient know her order was changed because her insurance does not allow her to go to [facility].  Thank you.

## 2017-08-26 NOTE — Telephone Encounter (Signed)
I called Kandi and informed her that she will have to go to [facility] for her Neuro and GI appointments. I gave her the phone number to call.  [place]   Ins. Coordinator

## 2017-09-09 ENCOUNTER — Encounter (INDEPENDENT_AMBULATORY_CARE_PROVIDER_SITE_OTHER): Payer: Self-pay

## 2017-09-20 ENCOUNTER — Encounter (INDEPENDENT_AMBULATORY_CARE_PROVIDER_SITE_OTHER): Payer: Self-pay

## 2017-10-14 ENCOUNTER — Ambulatory Visit
Admission: RE | Admit: 2017-10-14 | Discharge: 2017-10-14 | Disposition: A | Discharge status: Home / Self Care | Payer: MEDICAID | Source: Ambulatory Visit

## 2017-10-14 DIAGNOSIS — K219 Gastro-esophageal reflux disease without esophagitis: Secondary | ICD-10-CM

## 2017-10-14 DIAGNOSIS — R1310 Dysphagia, unspecified: Secondary | ICD-10-CM | POA: Insufficient documentation

## 2017-10-14 DIAGNOSIS — K222 Esophageal obstruction: Secondary | ICD-10-CM

## 2017-10-14 DIAGNOSIS — K449 Diaphragmatic hernia without obstruction or gangrene: Secondary | ICD-10-CM | POA: Insufficient documentation

## 2017-10-14 MED ORDER — BARIUM SULFATE 98 % ORAL POWDER FOR SUSPENSION
135.00 mL | INHALATION_SUSPENSION | ORAL | Status: AC
Start: 2017-10-14 — End: 2017-10-14
  Administered 2017-10-14: 135 mL via ORAL
  Filled 2017-10-14: qty 135

## 2017-10-14 MED ORDER — SOD BICARB-CITRIC AC-SIMETH 2.21 GRAM-1.53 GRAM/4 GRAM GRANULES EFFERV
1.00 | GRANULES | ORAL | Status: AC
Start: 2017-10-14 — End: 2017-10-14
  Administered 2017-10-14: 1 via ORAL
  Filled 2017-10-14: qty 2

## 2017-10-14 MED ORDER — BARIUM SULFATE 96 % (W/W) ORAL POWDER FOR SUSPENSION
176.00 g | INHALATION_SUSPENSION | ORAL | Status: AC
Start: 2017-10-14 — End: 2017-10-14
  Administered 2017-10-14 (×2): 176 g via ORAL
  Filled 2017-10-14: qty 1

## 2017-10-19 NOTE — Progress Notes (Signed)
Left Message for patient to call office back regarding recent results. Please see result note.  Golden Hurter, LPN  1/73/5670, 14:10

## 2017-10-25 ENCOUNTER — Other Ambulatory Visit (INDEPENDENT_AMBULATORY_CARE_PROVIDER_SITE_OTHER): Payer: Self-pay

## 2017-10-25 DIAGNOSIS — K449 Diaphragmatic hernia without obstruction or gangrene: Secondary | ICD-10-CM

## 2017-11-10 ENCOUNTER — Other Ambulatory Visit (INDEPENDENT_AMBULATORY_CARE_PROVIDER_SITE_OTHER): Payer: Self-pay

## 2017-11-11 ENCOUNTER — Other Ambulatory Visit (INDEPENDENT_AMBULATORY_CARE_PROVIDER_SITE_OTHER): Payer: Self-pay

## 2017-11-11 DIAGNOSIS — F419 Anxiety disorder, unspecified: Secondary | ICD-10-CM

## 2017-11-11 DIAGNOSIS — F329 Major depressive disorder, single episode, unspecified: Principal | ICD-10-CM

## 2018-02-15 NOTE — Progress Notes (Deleted)
[facility]  [place] [address]  Dept: [phone]  Dept Fax: [phone]  Loc: [phone]  Loc Fax: [phone]  Patient: [name]  MRN: [id number]  Date of Birth: Mar 27, 1961  Date of Service: 02/16/2018    Chief Complaint: No chief complaint on file.      HPI: [name] is a 57 y.o. female presenting today ***.    Past Medical History:  Past Medical History:   Diagnosis Date   . Anesthesia complication     O2 level dropped   . Chronic obstructive pulmonary disease    . Depression    . Neurofibromatosis (CMS [place])    . Shortness of breath            Past Surgical History:  Past Surgical History:   Procedure Laterality Date   . HX CHOLECYSTECTOMY  2011   . HX EYE SURGERY      Eye lid reduction   . HX HYSTERECTOMY  07/2011   . 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 PB COLONOSCOPY,DIAGNOSTIC N/A 04/18/2013    COLONOSCOPY performed by Levie Heritage, MD at [place] [facility] ([place])           Family History:  Family Medical History:     Problem Relation (Age of Onset)    Alcohol abuse Mother, Sister    Cancer Father, Maternal Aunt, Maternal Uncle, Maternal Grandmother, Maternal Uncle, Maternal Uncle    Coronary Artery Disease Father    Diabetes Sister    Heart Attack Father    Hypertension (High Blood Pressure) Mother    Other Sister    Thyroid Disease Sister              Social History:   Social History     Socioeconomic History   . Marital status: Legally Separated     Spouse name: Not on file   . Number of children: Not on file   . Years of education: Not on file   . Highest education level: Not on file   Tobacco Use   . Smoking status: Current Every Day Smoker     Packs/day: 1.00     Years: 35.00     Pack years: 35.00     Types: Cigarettes     Last attempt to quit: 03/19/2016     Years since quitting: 1.9   . Smokeless tobacco: Never Used   Substance and Sexual Activity   . Alcohol use: No     Alcohol/week: 0.0 oz   . Drug use: No      . Sexual activity: Never   Other Topics Concern   . Ability to Walk 1 Flight of Steps without SOB/CP Yes   . Ability to Walk 2 Flight of Steps without SOB/CP No     Comment: sob   . Ability To Do Own ADL's Yes   . Caffeine Concern Yes   . Right hand dominant Yes       Allergies:  Allergies   Allergen Reactions   . Codeine Hives/ Urticaria       Medications:  Current Outpatient Medications   Medication Sig   . albuterol sulfate (PROAIR HFA) 90 mcg/actuation Inhalation HFA Aerosol Inhaler Take 1-2 Puffs by inhalation Every 6 hours as needed   . cholecalciferol, vitamin D3, 1,000 unit Oral Tablet Take 1,000 Units by mouth Once a day   .  cyanocobalamin (VITAMIN B 12) 1,000 mcg Oral Tablet Take 1,000 mcg by mouth Once a day   . cyclobenzaprine (FLEXERIL) 5 mg Oral Tablet Take 1 Tab (5 mg total) by mouth Three times a day as needed for Muscle spasms   . Garlic Oral Capsule Take by mouth Once a day   . meloxicam (MOBIC) 15 mg Oral Tablet TAKE 1 TABLET(15 MG) BY MOUTH EVERY DAY   . MULTIVITS-MIN/IRON/FA/LUTEIN (CENTRUM SILVER WOMEN ORAL) Take 1 Tab by mouth Once a day   . OMEGA-3S/DHA/EPA/FISH OIL (OMEGA 3 ORAL) Take by mouth Once a day   . PARoxetine (PAXIL) 40 mg Oral Tablet TAKE 1 TABLET(40 MG) BY MOUTH EVERY DAY   . raNITIdine (ZANTAC) 150 mg Oral Tablet Take 1 Tab (150 mg total) by mouth Twice daily   . rOPINIRole (REQUIP) 0.25 mg Oral Tablet Take 1 Tab (0.25 mg total) by mouth Every night (Patient not taking: Reported on 04/19/2016)   . varenicline (CHANTIX) dose pak 1 mg twice daily for 11 weeks (Patient not taking: Reported on 08/18/2017)       Problem List:  Patient Active Problem List   Diagnosis   . 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 COPD (chronic obstructive pulmonary disease) (CMS [facility])   . Anxiety and depression   . Hiatal hernia   . GERD (gastroesophageal reflux disease)   . Arthritis   . Well woman exam   . Restless legs syndrome (RLS)   . Tension headache, chronic   . Piriformis syndrome of right side   .  Strain of gluteus medius of right lower extremity   . Hot flash, menopausal   . Hyperlipidemia   . Fatigue   . Low serum cortisol level (CMS [facility])       Review of Systems:  Constitutional: negative for fevers, chills and weight loss  Eyes: negative for visual disturbance  Ears, nose, mouth, throat, and face: negative for hearing loss, earaches and hoarseness  Respiratory: negative for cough or dyspnea on exertion  Cardiovascular: negative for chest pressure/discomfort, palpitations, near-syncope, syncope  Gastrointestinal: negative for dysphagia, nausea, vomiting, diarrhea, constipation, abdominal pain and blood in bowel movements  Genitourinary:negative for frequency, dysuria and hematuria. No sexual concerns or vaginal irritation.    Integument/breast: negative for rash and changed mole  Hematologic/lymphatic: negative for lymphadenopathy  Musculoskeletal:negative for myalgias, arthralgias and muscle weakness  Neurological: negative for seizures and stroke  or TIA symptoms  Behavioral/Psych:  PHQ2 completed  Endocrine: negative for diabetic symptoms including polyuria, polydipsia and weight loss  Allergic/Immunologic: negative    Exam:  General Vitals: There were no vitals taken for this visit.  General: appears in good health, appears stated age and no distress  Eyes: Pupils equal and round, reactive to light and accomodation. , Conjunctivae/corneas clear, EOM's intact. Fundi benign.  HENT:Left TM and canal normal. , Right TM and canal normal. , Nose without erythema. , Mouth mucous membranes moist , Pharynx without injection or exudate. , No oral lesions.   Neck: No JVD or thyromegaly or lymphadenopathy and thyroid: not enlarged, symmetric, no tenderness/mass/nodules  Lungs: clear to auscultation bilaterally.   Cardiovascular:    Heart regular rate and rhythm, S1, S2 normal, no murmur, click, rub or gallop  Abdomen: soft, non-tender, non-distended, no hepatosplenomegaly, no masses and no hernias  Extremities: no  edema, redness or tenderness in the calves or thighs,  Skin: Skin color, texture, turgor normal. No rashes or lesions  Neurologic: gait is normal, CN II -  XII grossly intact  and alert and oriented x3  Lymphatics: no lymphadenopathy  Psychiatric: affect normal, behavior normal, thought content normal, judgement normal     Assessment and Plan:    ICD-10-CM    1. Anxiety and depression F41.9     F32.9    2. Neurofibromatosis 2 (CMS [facility]) Q85.02    3. Hiatal hernia K44.9      Tobacco cessation counseling performed.         No follow-ups on file.    Fabio Pierce, PA  Collaborative/Supervising physician Darrelyn Hillock, MD

## 2018-02-16 ENCOUNTER — Encounter (INDEPENDENT_AMBULATORY_CARE_PROVIDER_SITE_OTHER): Payer: Self-pay

## 2018-02-24 ENCOUNTER — Ambulatory Visit (INDEPENDENT_AMBULATORY_CARE_PROVIDER_SITE_OTHER): Payer: MEDICAID

## 2018-02-24 ENCOUNTER — Encounter (INDEPENDENT_AMBULATORY_CARE_PROVIDER_SITE_OTHER): Payer: Self-pay

## 2018-02-24 VITALS — BP 122/74 | HR 78 | Temp 97.5°F | Resp 18 | Ht 62.0 in | Wt 113.5 lb

## 2018-02-24 DIAGNOSIS — F419 Anxiety disorder, unspecified: Secondary | ICD-10-CM

## 2018-02-24 DIAGNOSIS — F17210 Nicotine dependence, cigarettes, uncomplicated: Secondary | ICD-10-CM

## 2018-02-24 DIAGNOSIS — M1990 Unspecified osteoarthritis, unspecified site: Secondary | ICD-10-CM

## 2018-02-24 DIAGNOSIS — G2581 Restless legs syndrome: Secondary | ICD-10-CM

## 2018-02-24 DIAGNOSIS — J449 Chronic obstructive pulmonary disease, unspecified: Secondary | ICD-10-CM

## 2018-02-24 DIAGNOSIS — F32A Depression, unspecified: Secondary | ICD-10-CM

## 2018-02-24 DIAGNOSIS — M62838 Other muscle spasm: Secondary | ICD-10-CM

## 2018-02-24 DIAGNOSIS — E785 Hyperlipidemia, unspecified: Secondary | ICD-10-CM

## 2018-02-24 DIAGNOSIS — K449 Diaphragmatic hernia without obstruction or gangrene: Secondary | ICD-10-CM

## 2018-02-24 DIAGNOSIS — Z1239 Encounter for other screening for malignant neoplasm of breast: Secondary | ICD-10-CM

## 2018-02-24 DIAGNOSIS — F329 Major depressive disorder, single episode, unspecified: Secondary | ICD-10-CM

## 2018-02-24 DIAGNOSIS — Q8502 Neurofibromatosis, type 2: Secondary | ICD-10-CM

## 2018-02-24 MED ORDER — CYCLOBENZAPRINE 5 MG TABLET
5.00 mg | ORAL_TABLET | Freq: Three times a day (TID) | ORAL | 5 refills | Status: AC | PRN
Start: 2018-02-24 — End: ?

## 2018-02-24 MED ORDER — ALBUTEROL SULFATE HFA 90 MCG/ACTUATION AEROSOL INHALER
1.0000 | INHALATION_SPRAY | Freq: Four times a day (QID) | RESPIRATORY_TRACT | 3 refills | Status: DC | PRN
Start: 2018-02-24 — End: 2018-06-05

## 2018-02-24 MED ORDER — MELOXICAM 15 MG TABLET
ORAL_TABLET | ORAL | 0 refills | Status: DC
Start: 2018-02-24 — End: 2018-03-21

## 2018-02-24 MED ORDER — RANITIDINE 150 MG TABLET
150.00 mg | ORAL_TABLET | Freq: Two times a day (BID) | ORAL | 1 refills | Status: DC
Start: 2018-02-24 — End: 2018-04-19

## 2018-02-24 MED ORDER — PAROXETINE 40 MG TABLET
ORAL_TABLET | ORAL | 1 refills | Status: AC
Start: 2018-02-24 — End: ?

## 2018-02-24 NOTE — Progress Notes (Signed)
[facility]  [place] [address]  Dept: [phone]  Dept Fax: [phone]  Loc: [phone]  Loc Fax: [phone]  Patient: [name]  MRN: [id number]  Date of Birth: 26-Oct-1960  Date of Service: 02/24/2018    Chief Complaint: Medication follow up and Medication Refill      HPI: [name] is a 57 y.o. female presenting today medication management and refills.  She has a 9 month history of dysphagia.  Patient c/o dysphagia to liquids and solids.  She states if she drinks her liquids too fast they get stuck in her mid esophagus.  She is unable to induce vomiting to alleviate the discomfort.  She also complains of dysphagia to solids.  She struggles with eating textured protein, eggs, and leafy greens.  She admits to a history of hiatal hernia but was told by her gastroenterologist that it cannot be repaired.  She would like a second opinion.  +CP with food sticking, coughing.  She denies abdominal pain, nausea, vomiting. Patient has a history of neurofibromatosis.  She had a colonoscopy, but  she has never had an endoscopy.  She is scheduled for a speech and swallowing evaluation and she agrees to schedule endoscopy.   She has a longstanding history of depression and anxiety treated with Paxil.   She denies HI, SI, crying, impulsivity.   She is also due for her screening mammogram.  She does not do a monthly SBE.  She admits to a family history of breast cancer in her MGM in her 80s.    Barium swallow from 10/2017  1. 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 Endoscopy could be performed to further  assess.  2. Small sliding-type hiatal hernia.  3. Moderate gastroesophageal reflux to the level of the midesophagus      Past Medical History:  Past Medical History:   Diagnosis Date   . Anesthesia complication     O2 level dropped   . Chronic obstructive pulmonary disease    . Depression    .  Neurofibromatosis (CMS [place])    . Shortness of breath            Past Surgical History:  Past Surgical History:   Procedure Laterality Date   . HX CHOLECYSTECTOMY  2011   . HX EYE SURGERY      Eye lid reduction   . HX HYSTERECTOMY  07/2011   . 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 PB COLONOSCOPY,DIAGNOSTIC N/A 04/18/2013    COLONOSCOPY performed by Levie Heritage, MD at [facility] ([place])           Family History:  Family Medical History:     Problem Relation (Age of Onset)    Alcohol abuse Mother, Sister    Cancer Father, Maternal Aunt, Maternal Uncle, Maternal Grandmother, Maternal Uncle, Maternal Uncle    Coronary Artery Disease Father    Diabetes Sister    Heart Attack Father    Hypertension (High Blood Pressure) Mother    Other Sister    Thyroid Disease Sister              Social History:   Social History     Socioeconomic History   . Marital status: Legally Separated     Spouse name: Not on file   . Number of children: Not on file   .  Years of education: Not on file   . Highest education level: Not on file   Tobacco Use   . Smoking status: Current Every Day Smoker     Packs/day: 1.00     Years: 35.00     Pack years: 35.00     Types: Cigarettes     Last attempt to quit: 03/19/2016     Years since quitting: 1.9   . Smokeless tobacco: Never Used   Substance and Sexual Activity   . Alcohol use: No     Alcohol/week: 0.0 oz   . Drug use: No   . Sexual activity: Never   Other Topics Concern   . Ability to Walk 1 Flight of Steps without SOB/CP Yes   . Ability to Walk 2 Flight of Steps without SOB/CP No     Comment: sob   . Ability To Do Own ADL's Yes   . Caffeine Concern Yes   . Right hand dominant Yes       Allergies:  Allergies   Allergen Reactions   . Codeine Hives/ Urticaria       Medications:  Current Outpatient Medications   Medication Sig   . albuterol sulfate (PROAIR HFA) 90 mcg/actuation Inhalation HFA Aerosol Inhaler Take 1-2 Puffs by inhalation Every 6 hours as needed   .  cholecalciferol, vitamin D3, 1,000 unit Oral Tablet Take 1,000 Units by mouth Once a day   . cyanocobalamin (VITAMIN B 12) 1,000 mcg Oral Tablet Take 1,000 mcg by mouth Once a day   . cyclobenzaprine (FLEXERIL) 5 mg Oral Tablet Take 1 Tab (5 mg total) by mouth Three times a day as needed for Muscle spasms   . Garlic Oral Capsule Take by mouth Once a day   . meloxicam (MOBIC) 15 mg Oral Tablet TAKE 1 TABLET(15 MG) BY MOUTH EVERY DAY   . MULTIVITS-MIN/IRON/FA/LUTEIN (CENTRUM SILVER WOMEN ORAL) Take 1 Tab by mouth Once a day   . OMEGA-3S/DHA/EPA/FISH OIL (OMEGA 3 ORAL) Take by mouth Once a day   . PARoxetine (PAXIL) 40 mg Oral Tablet TAKE 1 TABLET(40 MG) BY MOUTH EVERY DAY   . raNITIdine (ZANTAC) 150 mg Oral Tablet Take 1 Tab (150 mg total) by mouth Twice daily       Problem List:  Patient Active Problem List   Diagnosis   . 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 COPD (chronic obstructive pulmonary disease) (CMS [facility])   . Anxiety and depression   . Hiatal hernia   . GERD (gastroesophageal reflux disease)   . Arthritis   . Well woman exam   . Restless legs syndrome (RLS)   . Tension headache, chronic   . Piriformis syndrome of right side   . Strain of gluteus medius of right lower extremity   . Hot flash, menopausal   . Hyperlipidemia   . Fatigue   . Low serum cortisol level (CMS HCC)       Review of Systems:  Constitutional: negative for fevers, chills and weight loss  Eyes: negative for visual disturbance  Ears, nose, mouth, throat, and face: negative for hearing loss, earaches and hoarseness  Respiratory: negative for cough or dyspnea on exertion  Cardiovascular: negative for chest pressure/discomfort, palpitations, near-syncope, syncope  Gastrointestinal: See HPI  Genitourinary:negative for frequency, dysuria and hematuria. No sexual concerns or vaginal irritation   Integument/breast: negative for rash and changed mole  Hematologic/lymphatic: negative for lymphadenopathy  Musculoskeletal:negative for myalgias, and muscle  weakness +arthralgias   Neurological: negative for  seizures and stroke  or TIA symptoms  Behavioral/Psych:  PHQ2 completed  Endocrine: negative for diabetic symptoms including polyuria, polydipsia and weight loss  Allergic/Immunologic: negative    Exam:  General Vitals: BP 122/74   Pulse 78   Temp 36.4 C (97.5 F) (Tympanic)   Resp 18   Ht 1.575 m (5\' 2" )   Wt 51.5 kg (113 lb 8 oz)   BMI 20.76 kg/m   General: appears in good health, appears stated age and no distress  Eyes: Pupils equal and round, reactive to light and accomodation. , Conjunctivae/corneas clear, EOM's intact.    HENT:  Mouth mucous membranes moist , Pharynx without injection or exudate. , No oral lesions.   Neck: No JVD or thyromegaly or lymphadenopathy and thyroid: not enlarged, symmetric, no tenderness/mass/nodules  Lungs: clear to auscultation bilaterally.   Cardiovascular:    Heart regular rate and rhythm, S1, S2 normal, no murmur, click, rub or gallop  Abdomen: soft, non-distended, no hepatosplenomegaly, no masses and no hernias, mild epigastric tenderness to palpation  Extremities: no edema, redness or tenderness in the calves or thighs,  Skin: Skin color, texture, turgor normal. Diffuse fibromas.  Neurologic: gait is normal, CN II - XII grossly intact  and alert and oriented x3  Lymphatics: no lymphadenopathy  Psychiatric: affect normal, behavior normal, thought content normal, judgement normal   BREAST EXAM: examined in upright and supine position, no skin dimpling or peau d'orange, self-exam is taught and encouraged, abnormal left breast skin with nipple pedunculated skin tag, mammography schedule is discussed, screening mammogram is due and ordered    Assessment and Plan:    ICD-10-CM    1. Hyperlipidemia E78.5 COMPREHENSIVE METABOLIC PROFILE - [facility] ONLY     LIPID PANEL  Low glycemic load mediterranean diet encouraged   2. COPD (chronic obstructive pulmonary disease) (CMS HCC) J44.9 albuterol sulfate (PROAIR HFA) 90  mcg/actuation Inhalation HFA Aerosol Inhaler, 2 inhalations q 4-6 hours prn  Stop smoking, smoking cessation discussed     CBC/DIFF   3. Neurofibromatosis 2 (CMS HCC) Q85.02 Continue per neurology recommendations   4. Hiatal hernia K44.9 raNITIdine (ZANTAC) 150 mg Oral Tablet, take 1 tab po twice daily  Avoid late meals  Limit portion sizes  Eat slowly and chew food thoroughly     Refer to External Provider-James Carrier, MD to discuss Nissen Fundoplication   5. Anxiety and depression F41.9 PARoxetine (PAXIL) 40 mg Oral Tablet, take 1 tab po once daily  Adverse effects of the medication discussed    F32.9 THYROID STIMULATING HORMONE WITH FREE T4 REFLEX     VITAMIN D 25, TOTAL   6. Restless legs syndrome (RLS) G25.81 No change to current medications   7. Muscle spasm M62.838 cyclobenzaprine (FLEXERIL) 5 mg Oral Tablet, take 1 tab po three times daily   8. Osteoarthritis M19.90 meloxicam (MOBIC) 15 mg Oral Tablet, take 1 tab po once daily, must check BMP for additional refills to check renal fct   9. Breast cancer screening Z12.31 Mammogram: Screening Bilateral  Monthly SBE discussed and encouraged     Tobacco cessation counseling performed.         Return if symptoms worsen or fail to improve.    Fabio Pierce, PA  Collaborative/Supervising physician Darrelyn Hillock, MD

## 2018-02-24 NOTE — Nursing Note (Signed)
Chief Complaint:   Chief Complaint            Medication follow up     Medication Refill         Functional Health Screen        Vital Signs  BP 122/74   Pulse 78   Temp 36.4 C (97.5 F) (Tympanic)   Resp 18   Ht 1.575 m (5\' 2" )   Wt 51.5 kg (113 lb 8 oz)   BMI 20.76 kg/m       Social History     Tobacco Use   Smoking Status Current Every Day Smoker   . Packs/day: 1.00   . Years: 35.00   . Pack years: 35.00   . Types: Cigarettes   . Last attempt to quit: 03/19/2016   . Years since quitting: 1.9   Smokeless Tobacco Never Used     Allergies  Allergies   Allergen Reactions   . Codeine Hives/ Urticaria     Medication History  Reviewed for OTC medication and any new medications, provider will review medication history  Care Team  Patient Care Team:  Fabio Pierce, [place] as PCP - General ([place])  Immunizations - last 24 hours     None        Golden Hurter, LPN  6/31/4970, 26:37

## 2018-02-27 ENCOUNTER — Ambulatory Visit: Payer: MEDICAID

## 2018-02-27 DIAGNOSIS — J449 Chronic obstructive pulmonary disease, unspecified: Secondary | ICD-10-CM | POA: Insufficient documentation

## 2018-02-27 DIAGNOSIS — F419 Anxiety disorder, unspecified: Secondary | ICD-10-CM | POA: Insufficient documentation

## 2018-02-27 DIAGNOSIS — E785 Hyperlipidemia, unspecified: Secondary | ICD-10-CM | POA: Insufficient documentation

## 2018-02-27 DIAGNOSIS — F329 Major depressive disorder, single episode, unspecified: Secondary | ICD-10-CM | POA: Insufficient documentation

## 2018-02-27 LAB — LIPID PANEL
CHOL/HDL RATIO: 3.6
CHOLESTEROL: 244 mg/dL — ABNORMAL HIGH (ref 120–199)
HDL CHOL: 68 mg/dL (ref >39)
LDL CALC: 155 mg/dL — ABNORMAL HIGH (ref <=130)
TRIGLYCERIDES: 106 mg/dL (ref <150)

## 2018-02-27 LAB — CBC WITH DIFF
BASOPHIL #: 0.1 x10ˆ3/uL (ref 0.00–0.10)
BASOPHIL %: 1 % (ref 0–3)
EOSINOPHIL #: 0.2 10*3/uL (ref 0.00–0.50)
EOSINOPHIL %: 4 % (ref 0–5)
HCT: 46.9 % — ABNORMAL HIGH (ref 36.0–45.0)
HGB: 15.3 g/dL (ref 12.0–15.5)
LYMPHOCYTE #: 1.5 x10ˆ3/uL (ref 1.00–4.80)
MCH: 31.1 pg (ref 27.5–33.2)
MCHC: 32.7 g/dL (ref 32.0–36.0)
MCV: 95 fL (ref 82.0–97.0)
MONOCYTE #: 0.6 x10ˆ3/uL (ref 0.20–0.90)
MPV: 7.2 fL — ABNORMAL LOW (ref 7.4–10.5)
NEUTROPHIL %: 56 % (ref 43–76)
PLATELETS: 268 x10ˆ3/uL (ref 150–450)
RBC: 4.94 x10ˆ6/uL (ref 4.00–5.10)
RDW: 13.4 % (ref 11.0–16.0)
WBC: 5.1 x10ˆ3/uL (ref 4.0–11.0)

## 2018-02-27 LAB — COMPREHENSIVE METABOLIC PROFILE - BMC/JMC ONLY
ALBUMIN/GLOBULIN RATIO: 1.5 (ref 0.8–2.0)
ALBUMIN: 4.1 g/dL (ref 3.5–5.0)
ALKALINE PHOSPHATASE: 83 U/L (ref 38–126)
ALT (SGPT): 13 U/L — ABNORMAL LOW (ref 14–54)
AST (SGOT): 20 U/L (ref 15–41)
BILIRUBIN TOTAL: 0.6 mg/dL (ref 0.3–1.2)
BUN: 19 mg/dL (ref 6–20)
CALCIUM: 9 mg/dL (ref 8.6–10.3)
CHLORIDE: 107 mmol/L (ref 101–111)
CO2 TOTAL: 26 mmol/L (ref 22–32)
CREATININE: 0.68 mg/dL (ref 0.44–1.00)
ESTIMATED GFR: >60 mL/min/1.73mˆ2 (ref >60)
GLUCOSE: 92 mg/dL (ref 70–110)
POTASSIUM: 4.4 mmol/L (ref 3.4–5.1)
PROTEIN TOTAL: 6.8 g/dL (ref 6.4–8.3)
SODIUM: 141 mmol/L (ref 136–145)

## 2018-02-27 LAB — THYROID STIMULATING HORMONE WITH FREE T4 REFLEX: TSH: 3.385 u[IU]/mL (ref 0.340–5.330)

## 2018-02-28 ENCOUNTER — Other Ambulatory Visit (INDEPENDENT_AMBULATORY_CARE_PROVIDER_SITE_OTHER): Payer: Self-pay

## 2018-02-28 DIAGNOSIS — E559 Vitamin D deficiency, unspecified: Secondary | ICD-10-CM

## 2018-02-28 LAB — VITAMIN D 25, TOTAL: VITAMIN D, 25OH: 27 ng/mL — ABNORMAL LOW (ref 30–100)

## 2018-02-28 MED ORDER — ERGOCALCIFEROL (VITAMIN D2) 1,250 MCG (50,000 UNIT) CAPSULE
50000.00 [IU] | ORAL_CAPSULE | ORAL | 0 refills | Status: DC
Start: 2018-02-28 — End: 2018-05-23

## 2018-03-08 ENCOUNTER — Ambulatory Visit
Admission: RE | Admit: 2018-03-08 | Discharge: 2018-03-08 | Disposition: A | Discharge status: Home / Self Care | Payer: MEDICAID | Source: Ambulatory Visit

## 2018-03-08 DIAGNOSIS — Z1231 Encounter for screening mammogram for malignant neoplasm of breast: Secondary | ICD-10-CM | POA: Insufficient documentation

## 2018-03-08 DIAGNOSIS — L989 Disorder of the skin and subcutaneous tissue, unspecified: Secondary | ICD-10-CM | POA: Insufficient documentation

## 2018-03-08 DIAGNOSIS — Z1239 Encounter for other screening for malignant neoplasm of breast: Secondary | ICD-10-CM

## 2018-03-08 DIAGNOSIS — R928 Other abnormal and inconclusive findings on diagnostic imaging of breast: Secondary | ICD-10-CM

## 2018-03-09 ENCOUNTER — Ambulatory Visit (INDEPENDENT_AMBULATORY_CARE_PROVIDER_SITE_OTHER): Payer: Medicaid Other

## 2018-03-09 ENCOUNTER — Encounter (INDEPENDENT_AMBULATORY_CARE_PROVIDER_SITE_OTHER): Payer: Self-pay

## 2018-03-09 VITALS — BP 113/69 | HR 80 | Temp 98.5°F | Resp 18 | Ht 62.0 in | Wt 116.0 lb

## 2018-03-09 DIAGNOSIS — E785 Hyperlipidemia, unspecified: Secondary | ICD-10-CM

## 2018-03-09 DIAGNOSIS — J449 Chronic obstructive pulmonary disease, unspecified: Secondary | ICD-10-CM

## 2018-03-09 DIAGNOSIS — R1310 Dysphagia, unspecified: Secondary | ICD-10-CM

## 2018-03-09 DIAGNOSIS — K429 Umbilical hernia without obstruction or gangrene: Secondary | ICD-10-CM

## 2018-03-09 DIAGNOSIS — Z720 Tobacco use: Secondary | ICD-10-CM

## 2018-03-09 DIAGNOSIS — F17210 Nicotine dependence, cigarettes, uncomplicated: Secondary | ICD-10-CM

## 2018-03-09 DIAGNOSIS — Z716 Tobacco abuse counseling: Secondary | ICD-10-CM

## 2018-03-09 MED ORDER — ATORVASTATIN 10 MG TABLET
10.00 mg | ORAL_TABLET | Freq: Every evening | ORAL | 0 refills | Status: DC
Start: 2018-03-09 — End: 2018-05-27

## 2018-03-09 NOTE — Progress Notes (Signed)
[facility]  [place] [address]  Dept: [phone]  Dept Fax: [phone]  Loc: [phone]  Loc Fax: [phone]  Patient: [name]  MRN: [id number]  Date of Birth: 08-02-1961  Date of Service: 03/09/2018    Chief Complaint: Hyperlipidemia (F/U) and Dysphagia (F/U)      HPI: [name] is a 57 y.o. female presenting today for follow up on hyperlipidemia and dysphagia.  She has a 9 month history of dysphagia.Patient c/o dysphagia to liquids and solids. She states if she drinks her liquids too fast they get stuck in her mid esophagus. She is unable to induce vomiting to alleviate the discomfort. She also complains of dysphagia to solids. She struggles with eating textured protein, eggs, and leafy greens. She admits to a history of hiatal hernia but was told by her gastroenterologist that it cannot be repaired. She would like a second opinion.  +CP with food sticking, coughing. She denies abdominal pain, nausea, vomiting. Patient has a history of neurofibromatosis. She had a colonoscopy, but she has never had an endoscopy.  She is scheduled for a speech and swallowing evaluation and was referred for endoscopy consult with Dr. Lovett Calender on 04/19/2018.     Results for TERRIAH, REGGIO (MRN [id number]) as of 03/09/2018 15:58   Ref. Range 05/31/2016 07:33 10/14/2017 09:08 02/27/2018 07:30   WBC Latest Ref Range: 4.0 - 11.0 x10^3/uL   5.1   HGB Latest Ref Range: 12.0 - 15.5 g/dL   15.3   HCT Latest Ref Range: 36.0 - 45.0 %   46.9 (H)   PLATELET COUNT Latest Ref Range: 150 - 450 x10^3/uL   268   RBC Latest Ref Range: 4.00 - 5.10 x10^6/uL   4.94   MCV Latest Ref Range: 82.0 - 97.0 fL   95.0   MCHC Latest Ref Range: 32.0 - 36.0 g/dL   32.7   MCH Latest Ref Range: 27.5 - 33.2 pg   31.1   RDW Latest Ref Range: 11.0 - 16.0 %   13.4   MPV Latest Ref Range: 7.4 - 10.5 fL   7.2 (L)   PMN'S Latest Ref Range: 43 - 76 %   56   LYMPHOCYTES Latest Ref Range: 15 - 43 %   28      EOSINOPHIL Latest Ref Range: 0 - 5 %   4   MONOCYTES Latest Ref Range: 5 - 12 %   11   BASOPHILS Latest Ref Range: 0 - 3 %   1   PMN ABS Latest Ref Range: 1.50 - 6.50 x10^3/uL   2.90   LYMPHS ABS Latest Ref Range: 1.00 - 4.80 x10^3/uL   1.50   EOS ABS Latest Ref Range: 0.00 - 0.50 x10^3/uL   0.20   MONOS ABS Latest Ref Range: 0.20 - 0.90 x10^3/uL   0.60   BASOS ABS Latest Ref Range: 0.00 - 0.10 x10^3/uL   0.10   SODIUM Latest Ref Range: 136 - 145 mmol/L   141   POTASSIUM Latest Ref Range: 3.4 - 5.1 mmol/L   4.4   CHLORIDE Latest Ref Range: 101 - 111 mmol/L   107   CARBON DIOXIDE Latest Ref Range: 22 - 32 mmol/L   26   BUN Latest Ref Range: 6 - 20 mg/dL   19   CREATININE Latest Ref Range: 0.44 - 1.00 mg/dL   0.68   GLUCOSE Latest Ref Range: 70 - 110 mg/dL   92  ANION GAP Latest Ref Range: 3 - 11 mmol/L   8   BUN/CREAT RATIO Latest Ref Range: 6 - 22    28 (H)   ESTIMATED GLOMERULAR FILTRATION RATE Latest Ref Range: >60 mL/min/1.23m^2   >60   CALCIUM Latest Ref Range: 8.6 - 10.3 mg/dL   9.0   CHOLESTEROL Latest Ref Range: 120 - 199 mg/dL 229 (H)  244 (H)   HDL-CHOLESTEROL Latest Ref Range: >39 mg/dL 67 (H)  68   LDL (CALCULATED) Latest Ref Range: <=130 mg/dL 138 (H)  155 (H)   TRIGLYCERIDES Latest Ref Range: <150 mg/dL 121  106   VLDL (CALCULATED) Latest Ref Range: 5 - 35 mg/dL 24  21   CHOL/HDL RATIO Unknown 3.4  3.6   TSH Latest Ref Range: 0.340 - 5.330 uIU/mL   3.385   CORTISOL Latest Units: ug/dL 23.0     TOTAL PROTEIN Latest Ref Range: 6.4 - 8.3 g/dL   6.8   ALBUMIN Latest Ref Range: 3.5 - 5.0 g/dL   4.1   BILIRUBIN, TOTAL Latest Ref Range: 0.3 - 1.2 mg/dL   0.6   AST (SGOT) Latest Ref Range: 15 - 41 U/L   20   ALT (SGPT) Latest Ref Range: 14 - 54 U/L   13 (L)   ALKALINE PHOSPHATASE Latest Ref Range: 38 - 126 U/L   83   A/G RATIO Latest Ref Range: 0.8 - 2.0    1.5   .    Past Medical History:  Past Medical History:   Diagnosis Date   . Anesthesia complication     O2 level dropped   . Chronic obstructive  pulmonary disease    . Depression    . Neurofibromatosis (CMS [place])    . Shortness of breath            Past Surgical History:  Past Surgical History:   Procedure Laterality Date   . HX CHOLECYSTECTOMY  2011   . HX EYE SURGERY      Eye lid reduction   . HX HYSTERECTOMY  07/2011    FULL HYSTERECTOMY   . 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 PB COLONOSCOPY,DIAGNOSTIC N/A 04/18/2013    COLONOSCOPY performed by Levie Heritage, MD at [facility] ([place])           Family History:  Family Medical History:     Problem Relation (Age of Onset)    Alcohol abuse Mother, Sister    Breast Cancer Maternal Grandmother    Cancer Father, Maternal Aunt, Maternal Uncle, Maternal Grandmother, Maternal Uncle, Maternal Uncle    Coronary Artery Disease Father    Diabetes Sister    Heart Attack Father    Hypertension (High Blood Pressure) Mother    Other Sister    Thyroid Disease Sister              Social History:   Social History     Socioeconomic History   . Marital status: Widowed     Spouse name: Not on file   . Number of children: Not on file   . Years of education: Not on file   . Highest education level: Not on file   Tobacco Use   . Smoking status: Current Every Day Smoker     Packs/day: 1.00     Years: 35.00     Pack years: 35.00     Types: Cigarettes     Last attempt  to quit: 03/19/2016     Years since quitting: 1.9   . Smokeless tobacco: Never Used   Substance and Sexual Activity   . Alcohol use: No     Alcohol/week: 0.0 oz   . Drug use: No   . Sexual activity: Never   Other Topics Concern   . Ability to Walk 1 Flight of Steps without SOB/CP Yes   . Ability to Walk 2 Flight of Steps without SOB/CP No     Comment: sob   . Ability To Do Own ADL's Yes   . Caffeine Concern Yes   . Right hand dominant Yes       Allergies:  Allergies   Allergen Reactions   . Codeine Hives/ Urticaria       Medications:  Current Outpatient Medications   Medication Sig   . albuterol sulfate (PROAIR HFA) 90 mcg/actuation  Inhalation HFA Aerosol Inhaler Take 1-2 Puffs by inhalation Every 6 hours as needed   . atorvastatin (LIPITOR) 10 mg Oral Tablet Take 1 Tab (10 mg total) by mouth Every evening   . cyanocobalamin (VITAMIN B 12) 1,000 mcg Oral Tablet Take 1,000 mcg by mouth Once a day   . cyclobenzaprine (FLEXERIL) 5 mg Oral Tablet Take 1 Tab (5 mg total) by mouth Three times a day as needed for Muscle spasms   . ergocalciferol, vitamin D2, (DRISDOL) 50,000 unit Oral Capsule Take 1 Cap (50,000 Units total) by mouth Every 7 days   . Garlic Oral Capsule Take by mouth Once a day   . meloxicam (MOBIC) 15 mg Oral Tablet TAKE 1 TABLET(15 MG) BY MOUTH EVERY DAY   . MULTIVITS-MIN/IRON/FA/LUTEIN (CENTRUM SILVER WOMEN ORAL) Take 1 Tab by mouth Once a day   . OMEGA-3S/DHA/EPA/FISH OIL (OMEGA 3 ORAL) Take by mouth Once a day   . PARoxetine (PAXIL) 40 mg Oral Tablet TAKE 1 TABLET(40 MG) BY MOUTH EVERY DAY   . raNITIdine (ZANTAC) 150 mg Oral Tablet Take 1 Tab (150 mg total) by mouth Twice daily       Problem List:  Patient Active Problem List   Diagnosis   . 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 COPD (chronic obstructive pulmonary disease) (CMS HCC)   . Anxiety and depression   . Hiatal hernia   . GERD (gastroesophageal reflux disease)   . Arthritis   . Well woman exam   . Restless legs syndrome (RLS)   . Tension headache, chronic   . Piriformis syndrome of right side   . Strain of gluteus medius of right lower extremity   . Hot flash, menopausal   . Hyperlipidemia   . Fatigue   . Low serum cortisol level (CMS HCC)       Review of Systems:  Constitutional: negative for fevers, chills and weight loss  Eyes: negative for visual disturbance  Ears, nose, mouth, throat, and face: negative for hearing loss, earaches and hoarseness  Respiratory: negative for cough or dyspnea on exertion  Cardiovascular: negative for chest pressure/discomfort, palpitations, near-syncope, syncope  Gastrointestinal: negative for dysphagia, nausea, vomiting, diarrhea,  constipation, abdominal pain and blood in bowel movements  Genitourinary:negative for frequency, dysuria and hematuria. No sexual concerns or vaginal irritation   Integument/breast: negative for rash and changed mole  Hematologic/lymphatic: negative for lymphadenopathy  Musculoskeletal:negative for myalgias, arthralgias and muscle weakness  Neurological: negative for seizures and stroke  or TIA symptoms  Behavioral/Psych:  PHQ2 completed  Endocrine: negative for diabetic symptoms including polyuria, polydipsia and weight loss  Allergic/Immunologic: negative    Exam:  General Vitals: BP 113/69   Pulse 80   Temp 36.9 C (98.5 F) (Oral)   Resp 18   Ht 1.575 m (5\' 2" )   Wt 52.6 kg (116 lb)   BMI 21.22 kg/m   General: appears chronically ill, appears stated age and no distress  Eyes: Pupils equal and round, reactive to light and accomodation. , Conjunctivae/corneas clear, EOM's intact.   HENT:  Mouth mucous membranes moist , Pharynx without injection or exudate. , No oral lesions.   Neck: No JVD or thyromegaly or lymphadenopathy and thyroid: not enlarged, symmetric, no tenderness/mass/nodules  Lungs: clear to auscultation bilaterally.   Cardiovascular:    Heart regular rate and rhythm, S1, S2 normal, no murmur, click, rub or gallop  Abdomen: soft, non-tender, non-distended, no hepatosplenomegaly, no masses and no hernias  Skin: Skin color, texture, turgor normal. No rashes. Diffuse fibromas.  Neurologic: gait is normal, CN II - XII grossly intact  and alert and oriented x3  Psychiatric: affect normal, behavior normal, thought content normal, judgement normal     Assessment and Plan:    ICD-10-CM    1. Hyperlipidemia E78.5 atorvastatin (LIPITOR) 10 mg Oral Tablet, take 1 tab po qpm  Will recheck CMP in 30 days, and lipid panel in 90 days.  Low glycemic load mediterranean diet  encouraged     COMPREHENSIVE METABOLIC PROFILE - [facility] ONLY   2. Dysphagia R13.10 No change to current medications  Patient referred to  GI for endoscopy   3. Tobacco abuse Z72.0 Smoking cessation discussed.  Recommend [id number] for product and counseling information.     Tobacco cessation counseling performed.         Return in about 3 months (around 06/09/2018), or if symptoms worsen or fail to improve.    Fabio Pierce, PA  Collaborative/Supervising physician Darrelyn Hillock, MD

## 2018-03-09 NOTE — Nursing Note (Signed)
Chief Complaint:   Chief Complaint            Hyperlipidemia F/U    Dysphagia F/U        Functional Health Screen        Vital Signs  BP 113/69   Pulse 80   Temp 36.9 C (98.5 F) (Oral)   Resp 18   Ht 1.575 m (5\' 2" )   Wt 52.6 kg (116 lb)   BMI 21.22 kg/m       Social History     Tobacco Use   Smoking Status Current Every Day Smoker   . Packs/day: 1.00   . Years: 35.00   . Pack years: 35.00   . Types: Cigarettes   . Last attempt to quit: 03/19/2016   . Years since quitting: 1.9   Smokeless Tobacco Never Used     Allergies  Allergies   Allergen Reactions   . Codeine Hives/ Urticaria     Medication History  Reviewed for OTC medication and any new medications, provider will review medication history  Care Team  Patient Care Team:  Fabio Pierce, [place] as PCP - General ([place])  Immunizations - last 24 hours     None        Golden Hurter, LPN  04/11/2670, 24:58

## 2018-03-16 ENCOUNTER — Encounter (INDEPENDENT_AMBULATORY_CARE_PROVIDER_SITE_OTHER): Payer: Self-pay

## 2018-03-16 ENCOUNTER — Other Ambulatory Visit (INDEPENDENT_AMBULATORY_CARE_PROVIDER_SITE_OTHER): Payer: Self-pay

## 2018-03-16 DIAGNOSIS — N649 Disorder of breast, unspecified: Secondary | ICD-10-CM

## 2018-03-16 DIAGNOSIS — L988 Other specified disorders of the skin and subcutaneous tissue: Secondary | ICD-10-CM

## 2018-03-17 NOTE — Progress Notes (Signed)
LVM to advise Pt of results and referral being place. Jacobo Forest, MA  03/17/2018, 14:18

## 2018-03-21 ENCOUNTER — Other Ambulatory Visit (INDEPENDENT_AMBULATORY_CARE_PROVIDER_SITE_OTHER): Payer: Self-pay

## 2018-03-21 DIAGNOSIS — M1990 Unspecified osteoarthritis, unspecified site: Secondary | ICD-10-CM

## 2018-04-05 ENCOUNTER — Encounter (INDEPENDENT_AMBULATORY_CARE_PROVIDER_SITE_OTHER): Payer: Self-pay

## 2018-04-17 ENCOUNTER — Ambulatory Visit: Payer: Medicaid Other

## 2018-04-17 ENCOUNTER — Other Ambulatory Visit (INDEPENDENT_AMBULATORY_CARE_PROVIDER_SITE_OTHER): Payer: Self-pay

## 2018-04-17 DIAGNOSIS — E875 Hyperkalemia: Secondary | ICD-10-CM

## 2018-04-17 DIAGNOSIS — E785 Hyperlipidemia, unspecified: Secondary | ICD-10-CM | POA: Insufficient documentation

## 2018-04-17 LAB — COMPREHENSIVE METABOLIC PROFILE - BMC/JMC ONLY
ALBUMIN/GLOBULIN RATIO: 1.5 (ref 0.8–2.0)
ALKALINE PHOSPHATASE: 89 U/L (ref 38–126)
ALT (SGPT): 12 U/L — ABNORMAL LOW (ref 14–54)
ANION GAP: 7 mmol/L (ref 3–11)
BILIRUBIN TOTAL: 0.7 mg/dL (ref 0.3–1.2)
BUN/CREA RATIO: 19 (ref 6–22)
CALCIUM: 9.7 mg/dL (ref 8.6–10.3)
CHLORIDE: 105 mmol/L (ref 101–111)
CO2 TOTAL: 28 mmol/L (ref 22–32)
CREATININE: 0.85 mg/dL (ref 0.44–1.00)
GLUCOSE: 94 mg/dL (ref 70–110)
POTASSIUM: 5.8 mmol/L — ABNORMAL HIGH (ref 3.4–5.1)
PROTEIN TOTAL: 7.4 g/dL (ref 6.4–8.3)
SODIUM: 140 mmol/L (ref 136–145)

## 2018-04-19 ENCOUNTER — Encounter (INDEPENDENT_AMBULATORY_CARE_PROVIDER_SITE_OTHER): Payer: Self-pay

## 2018-04-19 ENCOUNTER — Ambulatory Visit (INDEPENDENT_AMBULATORY_CARE_PROVIDER_SITE_OTHER): Payer: Medicaid Other

## 2018-04-19 VITALS — BP 109/67 | HR 87 | Temp 99.0°F | Ht 62.0 in | Wt 118.0 lb

## 2018-04-19 DIAGNOSIS — R1310 Dysphagia, unspecified: Secondary | ICD-10-CM

## 2018-04-19 DIAGNOSIS — Z5181 Encounter for therapeutic drug level monitoring: Secondary | ICD-10-CM

## 2018-04-19 DIAGNOSIS — K449 Diaphragmatic hernia without obstruction or gangrene: Secondary | ICD-10-CM

## 2018-04-19 DIAGNOSIS — F17210 Nicotine dependence, cigarettes, uncomplicated: Secondary | ICD-10-CM

## 2018-04-19 DIAGNOSIS — K219 Gastro-esophageal reflux disease without esophagitis: Secondary | ICD-10-CM

## 2018-04-19 DIAGNOSIS — K222 Esophageal obstruction: Secondary | ICD-10-CM

## 2018-04-19 MED ORDER — PANTOPRAZOLE 40 MG TABLET,DELAYED RELEASE
40.0000 mg | DELAYED_RELEASE_TABLET | Freq: Every day | ORAL | 1 refills | Status: AC
Start: 2018-04-19 — End: ?

## 2018-04-19 MED ORDER — RANITIDINE 300 MG TABLET
300.00 mg | ORAL_TABLET | Freq: Every evening | ORAL | 1 refills | Status: DC
Start: 2018-04-19 — End: 2018-05-17

## 2018-04-19 NOTE — Progress Notes (Signed)
[name], [facility]  [place] [address]       Name: [name] MRN:  [id number]   Date: 04/19/2018 Age: 57 y.o.     Requesting Physician: Fabio Pierce, PA  History of Present Illness  [name] is a 57 y.o. female who presents with a chief complaint of Esophageal Reflux   to clinic.Pt had esophagram 10-14-2017 Small hiatal hernia ,mild distal esophageal stricture and reflux the patient had screening colonoscopy with Dr.W Kenton Kingfisher 04/2013 and was reported as normal. 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    Pt has over a year of intermittent dysphagia to solids.She has heartburn helped with zantac but not completely.No melena or brbpr.No past EGD.  Past History  Current Outpatient Medications   Medication Sig   . albuterol sulfate (PROAIR HFA) 90 mcg/actuation Inhalation HFA Aerosol Inhaler Take 1-2 Puffs by inhalation Every 6 hours as needed   . atorvastatin (LIPITOR) 10 mg Oral Tablet Take 1 Tab (10 mg total) by mouth Every evening   . cyanocobalamin (VITAMIN B 12) 1,000 mcg Oral Tablet Take 1,000 mcg by mouth Once a day   . cyclobenzaprine (FLEXERIL) 5 mg Oral Tablet Take 1 Tab (5 mg total) by mouth Three times a day as needed for Muscle spasms   . ergocalciferol, vitamin D2, (DRISDOL) 50,000 unit Oral Capsule Take 1 Cap (50,000 Units total) by mouth Every 7 days   . Garlic Oral Capsule Take by mouth Once a day   . meloxicam (MOBIC) 15 mg Oral Tablet TAKE 1 TABLET(15 MG) BY MOUTH EVERY DAY   . MULTIVITS-MIN/IRON/FA/LUTEIN (CENTRUM SILVER WOMEN ORAL) Take 1 Tab by mouth Once a day   . OMEGA-3S/DHA/EPA/FISH OIL (OMEGA 3 ORAL) Take by mouth Once a day   . pantoprazole (PROTONIX) 40 mg Oral Tablet, Delayed Release (E.C.) Take 1 Tab (40 mg total) by mouth Once a day Before breakfast   . PARoxetine (PAXIL) 40 mg Oral Tablet TAKE 1 TABLET(40 MG) BY MOUTH EVERY DAY   . raNITIdine (ZANTAC) 300 mg Oral Tablet Take 1 Tab (300 mg total) by  mouth Every night     Allergies   Allergen Reactions   . Codeine Hives/ Urticaria     Past Medical History:   Diagnosis Date   . Anesthesia complication     O2 level dropped   . Chronic obstructive pulmonary disease    . Depression    . Neurofibromatosis (CMS [place])    . Shortness of breath          Past Surgical History:   Procedure Laterality Date   . HX CHOLECYSTECTOMY  2011   . HX EYE SURGERY      Eye lid reduction   . HX HYSTERECTOMY  07/2011    FULL HYSTERECTOMY   . 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 PB COLONOSCOPY,DIAGNOSTIC N/A 04/18/2013    COLONOSCOPY performed by Levie Heritage, MD at [place] ([place])         Family History  Family Medical History:     Problem Relation (Age of Onset)    Alcohol abuse Mother, Sister    Breast Cancer Maternal Grandmother    Cancer Father, Maternal Aunt, Maternal Uncle, Maternal Grandmother, Maternal Uncle, Maternal Uncle    Coronary Artery Disease Father    Diabetes Sister    Heart Attack Father    Hypertension (High Blood Pressure) Mother  Other Sister    Thyroid Disease Sister            Social History  Social History     Socioeconomic History   . Marital status: Widowed     Spouse name: Not on file   . Number of children: Not on file   . Years of education: Not on file   . Highest education level: Not on file   Tobacco Use   . Smoking status: Current Every Day Smoker     Packs/day: 1.00     Years: 35.00     Pack years: 35.00     Types: Cigarettes     Last attempt to quit: 03/19/2016     Years since quitting: 2.0   . Smokeless tobacco: Never Used   Substance and Sexual Activity   . Alcohol use: No     Alcohol/week: 0.0 standard drinks   . Drug use: No   . Sexual activity: Never   Other Topics Concern   . Ability to Walk 1 Flight of Steps without SOB/CP Yes   . Ability to Walk 2 Flight of Steps without SOB/CP No     Comment: sob   . Ability To Do Own ADL's Yes   . Caffeine Concern Yes   . Right hand dominant Yes     Review of  Systems    Constitutional: negative  Eyes: negative  Ears, nose, mouth, throat, and face: negative  Respiratory: negative  Cardiovascular: negative  Gastrointestinal: positive for dysphagia and reflux symptoms, negative for odynophagia, dyspepsia, nausea, vomiting, change in bowel habits, melena, diarrhea, constipation, abdominal pain and jaundice  Genitourinary:negative  Integument/breast: positive for neurofibromas on skin  Hematologic/lymphatic: negative  Musculoskeletal:negative  Neurological: negative  Behavioral/Psych: negative  Endocrine: negative  Allergic/Immunologic: see allergy list    Examination  Vitals: BP 109/67   Pulse 87   Temp 37.2 C (99 F) (Tympanic)   Ht 1.575 m (5\' 2" )   Wt 53.5 kg (118 lb)   BMI 21.58 kg/m     General: no distress  Eyes: Conjunctiva clear.  Neck: supple, symmetrical, trachea midline  Lungs: clear to auscultation bilaterally.   Cardiovascular:    Heart regular rate and rhythm  Abdomen: soft, non-tender  Extremities: no cyanosis or edema  Skin: Skin warm and dry  Neurologic: grossly normal  Lymphatics: no supraclavicular  lymphadenopathy  Psychiatric: affect normal  Impression  1. GERD (gastroesophageal reflux disease)    2. Hiatal hernia    3. Dysphagia    4. Esophageal stricture    5. Medication monitoring encounter      Recommendations:  Orders Placed This Encounter   . CBC/DIFF   . COMPREHENSIVE METABOLIC PROFILE - [facility] ONLY   . PT/INR   . MAGNESIUM   . VITAMIN B12   . UHP ENDOSCOPY REQUEST (AMB)   . pantoprazole (PROTONIX) 40 mg Oral Tablet, Delayed Release (E.C.)   . raNITIdine (ZANTAC) 300 mg Oral Tablet     Patient Instructions   Eat slowly avoid tough meats and dry breads.    Measures To Help Decrease Reflux-Heartburn     Elevate head of the bed six inches   Small frequent meals   Avoid late night meals   Avoid excess mints, peppermints, chocolate, raw onions, garlic, alcohol, fatty foods, coffee, tea, tomatoe and citric products   Avoid  Smoking        Start Protonix 40 mg in morning before breakfast a strong acid reducer and take zantac  300 mg nightly either before dinner oir before bedtime.    Set up upper endoscopy to do in Hospital on a Thursday in room where x ray can be done with possible savory dilatation.The risks of upper endoscopy include infection,bleeding,perforation,sedation-anesthesia reaction,respiratory and/or cardiac compromise.Hold aspirin and NSAID's  Like YOUR MOBIC, motrin,alleve,nuprin ibuprofen,etc for 1 week prior to and after endoscopic procedure(s) unless taking for specific heart or neurologic reason as they thin blood.      Get blood tests 1-2 weeks prior to upper endoscopy.    Eudelia Bunch, MD

## 2018-04-19 NOTE — Patient Instructions (Addendum)
Eat slowly avoid tough meats and dry breads.    Measures To Help Decrease Reflux-Heartburn     Elevate head of the bed six inches   Small frequent meals   Avoid late night meals   Avoid excess mints, peppermints, chocolate, raw onions, garlic, alcohol, fatty foods, coffee, tea, tomatoe and citric products   Avoid Smoking        Start Protonix 40 mg in morning before breakfast a strong acid reducer and take zantac 300 mg nightly either before dinner oir before bedtime.    Set up upper endoscopy to do in Hospital on a Thursday in room where x ray can be done with possible savory dilatation.The risks of upper endoscopy include infection,bleeding,perforation,sedation-anesthesia reaction,respiratory and/or cardiac compromise.Hold aspirin and NSAID's  Like YOUR MOBIC, motrin,alleve,nuprin ibuprofen,etc for 1 week prior to and after endoscopic procedure(s) unless taking for specific heart or neurologic reason as they thin blood.      Get blood tests 1-2 weeks prior to upper endoscopy.

## 2018-04-19 NOTE — Progress Notes (Signed)
2nd VM left for pt. To return call for lab results.  Golden Hurter, LPN  11/09/7541, 60:67

## 2018-04-21 ENCOUNTER — Ambulatory Visit (INDEPENDENT_AMBULATORY_CARE_PROVIDER_SITE_OTHER): Payer: Medicaid Other

## 2018-04-21 ENCOUNTER — Other Ambulatory Visit (INDEPENDENT_AMBULATORY_CARE_PROVIDER_SITE_OTHER): Payer: Self-pay

## 2018-04-21 ENCOUNTER — Ambulatory Visit: Payer: Medicaid Other

## 2018-04-21 VITALS — Temp 97.8°F

## 2018-04-21 DIAGNOSIS — E875 Hyperkalemia: Secondary | ICD-10-CM

## 2018-04-21 DIAGNOSIS — Z23 Encounter for immunization: Principal | ICD-10-CM

## 2018-04-21 LAB — BASIC METABOLIC PANEL
ANION GAP: 6 mmol/L (ref 3–11)
BUN/CREA RATIO: 22 (ref 6–22)
BUN: 19 mg/dL (ref 6–20)
CALCIUM: 9 mg/dL (ref 8.6–10.3)
CHLORIDE: 106 mmol/L (ref 101–111)
CO2 TOTAL: 28 mmol/L (ref 22–32)
CREATININE: 0.88 mg/dL (ref 0.44–1.00)
GLUCOSE: 132 mg/dL — ABNORMAL HIGH (ref 70–110)
POTASSIUM: 5.5 mmol/L — ABNORMAL HIGH (ref 3.4–5.1)
SODIUM: 140 mmol/L (ref 136–145)

## 2018-04-21 NOTE — Progress Notes (Signed)
lvm for pt to call office.    Desiree Hane, LPN  05/07/2445, 28:63

## 2018-04-21 NOTE — Nursing Note (Signed)
Patient came into the office today for influenza vaccine. Temp 36.6 C (97.8 F) (Tympanic) . Pt. denies allergies to eggs, latex, gelatin, or Neomycin. Injection given in LD, tolerated well with no complications.  Marthenia Rolling, [place]  04/21/2018, 09:49

## 2018-04-24 NOTE — Progress Notes (Signed)
2nd message for pt. To call office back regarding hyperkalemia.  Golden Hurter, LPN  03/20/7516, 00:17

## 2018-04-24 NOTE — Progress Notes (Signed)
Patient called and advised of improving K+ but advised that level is still to high.  Patient advised to repeat today and cont with no bananas, oranges, choc or other K+ rich food.  Patient voiced understanding.    Desiree Hane, LPN  0/05/9322, 55:73

## 2018-04-25 ENCOUNTER — Other Ambulatory Visit (INDEPENDENT_AMBULATORY_CARE_PROVIDER_SITE_OTHER): Payer: Self-pay

## 2018-04-25 DIAGNOSIS — M1990 Unspecified osteoarthritis, unspecified site: Secondary | ICD-10-CM

## 2018-04-27 ENCOUNTER — Telehealth (INDEPENDENT_AMBULATORY_CARE_PROVIDER_SITE_OTHER): Payer: Self-pay

## 2018-04-27 ENCOUNTER — Encounter (HOSPITAL_BASED_OUTPATIENT_CLINIC_OR_DEPARTMENT_OTHER): Payer: Self-pay

## 2018-04-27 ENCOUNTER — Ambulatory Visit (HOSPITAL_BASED_OUTPATIENT_CLINIC_OR_DEPARTMENT_OTHER)
Admission: RE | Admit: 2018-04-27 | Discharge: 2018-04-27 | Disposition: A | Discharge status: Home / Self Care | Payer: Medicaid Other | Source: Ambulatory Visit | Attending: Anesthesiology | Admitting: Anesthesiology

## 2018-04-27 ENCOUNTER — Encounter (HOSPITAL_BASED_OUTPATIENT_CLINIC_OR_DEPARTMENT_OTHER): Payer: Self-pay | Admitting: Anesthesiology

## 2018-04-27 VITALS — BP 119/69 | HR 85 | Ht 62.0 in | Wt 121.0 lb

## 2018-04-27 DIAGNOSIS — J449 Chronic obstructive pulmonary disease, unspecified: Secondary | ICD-10-CM | POA: Insufficient documentation

## 2018-04-27 DIAGNOSIS — R1310 Dysphagia, unspecified: Secondary | ICD-10-CM | POA: Insufficient documentation

## 2018-04-27 DIAGNOSIS — Z01818 Encounter for other preprocedural examination: Secondary | ICD-10-CM

## 2018-04-27 DIAGNOSIS — E785 Hyperlipidemia, unspecified: Secondary | ICD-10-CM | POA: Insufficient documentation

## 2018-04-27 DIAGNOSIS — Z01810 Encounter for preprocedural cardiovascular examination: Secondary | ICD-10-CM | POA: Insufficient documentation

## 2018-04-27 DIAGNOSIS — Q8502 Neurofibromatosis, type 2: Secondary | ICD-10-CM

## 2018-04-27 HISTORY — DX: Nicotine dependence, cigarettes, uncomplicated: F17.210

## 2018-04-27 HISTORY — DX: Unspecified osteoarthritis, unspecified site: M19.90

## 2018-04-27 HISTORY — DX: Neurofibromatosis, type 2 (CMS HCC): Q85.02

## 2018-04-27 HISTORY — DX: Dysphagia, unspecified: R13.10

## 2018-04-27 HISTORY — DX: Hyperkalemia: E87.5

## 2018-04-27 HISTORY — DX: Gastro-esophageal reflux disease without esophagitis: K21.9

## 2018-04-27 HISTORY — DX: Hyperlipidemia, unspecified: E78.5

## 2018-04-29 LAB — ECG 12-LEAD
Atrial Rate: 76 {beats}/min
Calculated P Axis: 91 degrees
Calculated R Axis: 97 degrees
Calculated T Axis: 78 degrees
PR Interval: 166 ms
QRS Duration: 92 ms
QT Interval: 404 ms
QTC Calculation: 454 ms
QTC Calculation: 454 ms
Ventricular rate: 76 {beats}/min

## 2018-05-01 ENCOUNTER — Ambulatory Visit: Payer: Medicaid Other

## 2018-05-01 DIAGNOSIS — Z5181 Encounter for therapeutic drug level monitoring: Secondary | ICD-10-CM | POA: Insufficient documentation

## 2018-05-01 DIAGNOSIS — K449 Diaphragmatic hernia without obstruction or gangrene: Secondary | ICD-10-CM | POA: Insufficient documentation

## 2018-05-01 DIAGNOSIS — R1310 Dysphagia, unspecified: Secondary | ICD-10-CM | POA: Insufficient documentation

## 2018-05-01 DIAGNOSIS — K219 Gastro-esophageal reflux disease without esophagitis: Secondary | ICD-10-CM | POA: Insufficient documentation

## 2018-05-01 DIAGNOSIS — K222 Esophageal obstruction: Secondary | ICD-10-CM | POA: Insufficient documentation

## 2018-05-01 LAB — CBC WITH DIFF
BASOPHIL #: 0.1 10*3/uL (ref 0.00–0.10)
BASOPHIL %: 1 % (ref 0–3)
EOSINOPHIL #: 0.2 x10ˆ3/uL (ref 0.00–0.50)
EOSINOPHIL %: 4 % (ref 0–5)
HCT: 45.2 % — ABNORMAL HIGH (ref 36.0–45.0)
HCT: 45.2 % — ABNORMAL HIGH (ref 36.0–45.0)
HGB: 15.2 g/dL (ref 12.0–15.5)
LYMPHOCYTE %: 24 % (ref 15–43)
MCH: 31.9 pg (ref 27.5–33.2)
MCHC: 33.7 g/dL (ref 32.0–36.0)
MCV: 94.8 fL (ref 82.0–97.0)
MONOCYTE #: 0.7 x10ˆ3/uL (ref 0.20–0.90)
MONOCYTE %: 13 % — ABNORMAL HIGH (ref 5–12)
MPV: 7.7 fL (ref 7.4–10.5)
NEUTROPHIL %: 58 % (ref 43–76)
PLATELETS: 249 x10ˆ3/uL (ref 150–450)
RBC: 4.77 x10ˆ6/uL (ref 4.00–5.10)
WBC: 5.8 10*3/uL (ref 4.0–11.0)

## 2018-05-01 LAB — COMPREHENSIVE METABOLIC PROFILE - BMC/JMC ONLY
ALBUMIN/GLOBULIN RATIO: 1.5 (ref 0.8–2.0)
ALT (SGPT): 19 U/L (ref 14–54)
ANION GAP: 7 mmol/L (ref 3–11)
AST (SGOT): 28 U/L (ref 15–41)
BILIRUBIN TOTAL: 0.6 mg/dL (ref 0.3–1.2)
BUN/CREA RATIO: 20 (ref 6–22)
BUN: 17 mg/dL (ref 6–20)
CALCIUM: 9.4 mg/dL (ref 8.6–10.3)
CO2 TOTAL: 29 mmol/L (ref 22–32)
CREATININE: 0.87 mg/dL (ref 0.44–1.00)
ESTIMATED GFR: >60 mL/min/1.73mˆ2 (ref >60)
GLUCOSE: 129 mg/dL — ABNORMAL HIGH (ref 70–110)
PROTEIN TOTAL: 7.3 g/dL (ref 6.4–8.3)
SODIUM: 140 mmol/L (ref 136–145)

## 2018-05-01 LAB — PT/INR: INR: 0.99

## 2018-05-01 LAB — MAGNESIUM: MAGNESIUM: 2.1 mg/dL (ref 1.6–2.6)

## 2018-05-02 LAB — VITAMIN B12: VITAMIN B 12: 796 pg/mL (ref 180–914)

## 2018-05-06 NOTE — H&P (Signed)
Alexa Bunch, MD   Physician   GASTROENTEROLOGY   Progress Notes   Signed   Encounter Date:  04/19/2018               Expand All Collapse All     [] Hide copied text    [] Hover for details     [facility]  [address]  [place] [address]        Name:            Kollyns Mickelson MRN:              [id number]   Date:              04/19/2018 Age:            57 y.o.      Requesting Physician: Fabio Pierce, PA  History of Present Illness  [name] is a 57 y.o. female who presents with a chief complaint of Esophageal Reflux   to clinic.Pt had esophagram 10-14-2017 Small hiatal hernia ,mild distal esophageal stricture and reflux the patient had screening colonoscopy with Dr.W Kenton Kingfisher 04/2013 and was reported as normal. 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     Pt has over a year of intermittent dysphagia to solids.She has heartburn helped with zantac but not completely.No melena or brbpr.No past EGD.  Past History  Current Outpatient Medications   Medication Sig   . albuterol sulfate (PROAIR HFA) 90 mcg/actuation Inhalation HFA Aerosol Inhaler Take 1-2 Puffs by inhalation Every 6 hours as needed   . atorvastatin (LIPITOR) 10 mg Oral Tablet Take 1 Tab (10 mg total) by mouth Every evening   . cyanocobalamin (VITAMIN B 12) 1,000 mcg Oral Tablet Take 1,000 mcg by mouth Once a day   . cyclobenzaprine (FLEXERIL) 5 mg Oral Tablet Take 1 Tab (5 mg total) by mouth Three times a day as needed for Muscle spasms   . ergocalciferol, vitamin D2, (DRISDOL) 50,000 unit Oral Capsule Take 1 Cap (50,000 Units total) by mouth Every 7 days   . Garlic Oral Capsule Take by mouth Once a day   . meloxicam (MOBIC) 15 mg Oral Tablet TAKE 1 TABLET(15 MG) BY MOUTH EVERY DAY   . MULTIVITS-MIN/IRON/FA/LUTEIN (CENTRUM SILVER WOMEN ORAL) Take 1 Tab by mouth Once a day   . OMEGA-3S/DHA/EPA/FISH OIL (OMEGA 3 ORAL) Take by mouth Once a day   . pantoprazole (PROTONIX) 40 mg Oral  Tablet, Delayed Release (E.C.) Take 1 Tab (40 mg total) by mouth Once a day Before breakfast   . PARoxetine (PAXIL) 40 mg Oral Tablet TAKE 1 TABLET(40 MG) BY MOUTH EVERY DAY   . raNITIdine (ZANTAC) 300 mg Oral Tablet Take 1 Tab (300 mg total) by mouth Every night           Allergies   Allergen Reactions   . Codeine Hives/ Urticaria           Past Medical History:   Diagnosis Date   . Anesthesia complication       O2 level dropped   . Chronic obstructive pulmonary disease     . Depression     . Neurofibromatosis (CMS [place])     . Shortness of breath                    Past Surgical History:   Procedure Laterality Date   . HX CHOLECYSTECTOMY   2011   . HX EYE SURGERY  Eye lid reduction   . HX HYSTERECTOMY   07/2011     FULL HYSTERECTOMY   . 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 PB COLONOSCOPY,DIAGNOSTIC N/A 04/18/2013     COLONOSCOPY performed by Levie Heritage, MD at [place] ([place])            Family History       Family Medical History:         Problem Relation (Age of Onset)     Alcohol abuse Mother, Sister     Breast Cancer Maternal Grandmother     Cancer Father, Maternal Aunt, Maternal Uncle, Maternal Grandmother, Maternal Uncle, Maternal Uncle     Coronary Artery Disease Father     Diabetes Sister     Heart Attack Father     Hypertension (High Blood Pressure) Mother     Other Sister     Thyroid Disease Sister                   Social History  Social History            Socioeconomic History   . Marital status: Widowed       Spouse name: Not on file   . Number of children: Not on file   . Years of education: Not on file   . Highest education level: Not on file   Tobacco Use   . Smoking status: Current Every Day Smoker       Packs/day: 1.00       Years: 35.00       Pack years: 35.00       Types: Cigarettes       Last attempt to quit: 03/19/2016       Years since quitting: 2.0   . Smokeless tobacco: Never Used   Substance and Sexual Activity   . Alcohol use: No        Alcohol/week: 0.0 standard drinks   . Drug use: No   . Sexual activity: Never   Other Topics Concern   . Ability to Walk 1 Flight of Steps without SOB/CP Yes   . Ability to Walk 2 Flight of Steps without SOB/CP No       Comment: sob   . Ability To Do Own ADL's Yes   . Caffeine Concern Yes   . Right hand dominant Yes      Review of Systems     Constitutional: negative  Eyes: negative  Ears, nose, mouth, throat, and face: negative  Respiratory: negative  Cardiovascular: negative  Gastrointestinal: positive for dysphagia and reflux symptoms, negative for odynophagia, dyspepsia, nausea, vomiting, change in bowel habits, melena, diarrhea, constipation, abdominal pain and jaundice  Genitourinary:negative  Integument/breast: positive for neurofibromas on skin  Hematologic/lymphatic: negative  Musculoskeletal:negative  Neurological: negative  Behavioral/Psych: negative  Endocrine: negative  Allergic/Immunologic: see allergy list     Examination  Vitals: BP 109/67   Pulse 87   Temp 37.2 C (99 F) (Tympanic)   Ht 1.575 m (5\' 2" )   Wt 53.5 kg (118 lb)   BMI 21.58 kg/m      General: no distress  Eyes: Conjunctiva clear.  Neck: supple, symmetrical, trachea midline  Lungs: clear to auscultation bilaterally.   Cardiovascular:    Heart regular rate and rhythm  Abdomen: soft, non-tender  Extremities: no cyanosis or edema  Skin: Skin warm and dry  Neurologic: grossly normal  Lymphatics: no  supraclavicular  lymphadenopathy  Psychiatric: affect normal  Impression  1. GERD (gastroesophageal reflux disease)    2. Hiatal hernia    3. Dysphagia    4. Esophageal stricture    5. Medication monitoring encounter       Recommendations:      Orders Placed This Encounter   . CBC/DIFF   . COMPREHENSIVE METABOLIC PROFILE - [facility] ONLY   . PT/INR   . MAGNESIUM   . VITAMIN B12   . [facility] ENDOSCOPY REQUEST (AMB)   . pantoprazole (PROTONIX) 40 mg Oral Tablet, Delayed Release (E.C.)   . raNITIdine (ZANTAC) 300 mg Oral Tablet      Patient  Instructions   Eat slowly avoid tough meats and dry breads.     Measures To Help Decrease Reflux-Heartburn      Elevate head of the bed six inches   Small frequent meals   Avoid late night meals   Avoid excess mints, peppermints, chocolate, raw onions, garlic, alcohol, fatty foods, coffee, tea, tomatoe and citric products   Avoid Smoking           Start Protonix 40 mg in morning before breakfast a strong acid reducer and take zantac 300 mg nightly either before dinner oir before bedtime.     Set up upper endoscopy to do in Hospital on a Thursday in room where x ray can be done with possible savory dilatation.The risks of upper endoscopy include infection,bleeding,perforation,sedation-anesthesia reaction,respiratory and/or cardiac compromise.Hold aspirin and NSAID's  Like YOUR MOBIC, motrin,alleve,nuprin ibuprofen,etc for 1 week prior to and after endoscopic procedure(s) unless taking for specific heart or neurologic reason as they thin blood.        Get blood tests 1-2 weeks prior to upper endoscopy.     Alexa Bunch, MD       Electronically signed by Alexa Bunch, MD at 04/19/18 1018      Office Visit on 04/19/2018         Routing History         Detailed Report

## 2018-05-11 ENCOUNTER — Encounter (INDEPENDENT_AMBULATORY_CARE_PROVIDER_SITE_OTHER): Payer: Self-pay

## 2018-05-11 DIAGNOSIS — Z720 Tobacco use: Secondary | ICD-10-CM | POA: Insufficient documentation

## 2018-05-11 NOTE — Progress Notes (Signed)
[facility]  [place] [address]  Dept: [phone]  Dept Fax: [phone]  Loc: [phone]  Loc Fax: [phone]  Patient: [name]  MRN: [id number]  Date of Birth: Sep 02, 1960  Date of Service: 05/12/2018    Chief Complaint: Lab Results (follow up)      HPI: [name] is a 57 y.o. female presenting today for follow up on recent lab studies.. Her labs show worsening hyperkalemia.  She states she does not take a K+ supplement.  Medications that could effect her K+ include Meloxicam which she uses for arthritis pain.  She had previously had been taking Gatorade and eating K+ rich food which she has limited in her diet.  She has stopped eating bananas, oranges, beans.  She is scheduled for upper endoscopy on 05/18/2018 and she needs her labs repeated prior to her EGD.  She states she is recovering from an URI.  She complaints of nasal congestion, chest congestion, productive cough x 2 weeks.  She is a smoker.    She denies fever, chills, lightheadedness, dizziness, Chest pain, palpitations, abdominal pain, nausea, vomiting, change in bowel habits.    She also requests medication for motion sickness.  She is flying to [place], then taking a transatlantic cruise back to [facility].  She has a fear of flying and she also gets motions sickness and requests medications.      Results for Alexa, Simon (MRN [id number]) as of 05/11/2018 14:23   Ref. Range 04/17/2018 08:38 04/21/2018 08:32 04/27/2018 14:12 05/01/2018 07:46   WBC Latest Ref Range: 4.0 - 11.0 x10^3/uL    5.8   HGB Latest Ref Range: 12.0 - 15.5 g/dL    15.2   HCT Latest Ref Range: 36.0 - 45.0 %    45.2 (H)   PLATELET COUNT Latest Ref Range: 150 - 450 x10^3/uL    249   RBC Latest Ref Range: 4.00 - 5.10 x10^6/uL    4.77   MCV Latest Ref Range: 82.0 - 97.0 fL    94.8   MCHC Latest Ref Range: 32.0 - 36.0 g/dL    33.7   MCH Latest Ref Range: 27.5 - 33.2 pg    31.9   RDW Latest Ref Range: 11.0 - 16.0 %    13.3      MPV Latest Ref Range: 7.4 - 10.5 fL    7.7   PMN'S Latest Ref Range: 43 - 76 %    58   LYMPHOCYTES Latest Ref Range: 15 - 43 %    24   EOSINOPHIL Latest Ref Range: 0 - 5 %    4   MONOCYTES Latest Ref Range: 5 - 12 %    13 (H)   BASOPHILS Latest Ref Range: 0 - 3 %    1   PMN ABS Latest Ref Range: 1.50 - 6.50 x10^3/uL    3.40   LYMPHS ABS Latest Ref Range: 1.00 - 4.80 x10^3/uL    1.40   EOS ABS Latest Ref Range: 0.00 - 0.50 x10^3/uL    0.20   MONOS ABS Latest Ref Range: 0.20 - 0.90 x10^3/uL    0.70   BASOS ABS Latest Ref Range: 0.00 - 0.10 x10^3/uL    0.10   PROTHROMBIN TIME Latest Ref Range: 9.2 - 12.3 seconds    11.1   INR Unknown    0.99   SODIUM Latest Ref Range: 136 - 145 mmol/L 140 140  140  POTASSIUM Latest Ref Range: 3.4 - 5.1 mmol/L 5.8 (H) 5.5 (H)  5.2 (H)   CHLORIDE Latest Ref Range: 101 - 111 mmol/L 105 106  104   CARBON DIOXIDE Latest Ref Range: 22 - 32 mmol/L 28 28  29    BUN Latest Ref Range: 6 - 20 mg/dL 16 19  17    CREATININE Latest Ref Range: 0.44 - 1.00 mg/dL 0.85 0.88  0.87   GLUCOSE Latest Ref Range: 70 - 110 mg/dL 94 132 (H)  129 (H)   ANION GAP Latest Ref Range: 3 - 11 mmol/L 7 6  7    BUN/CREAT RATIO Latest Ref Range: 6 - 22  19 22  20    ESTIMATED GLOMERULAR FILTRATION RATE Latest Ref Range: >60 mL/min/1.1m^2 >60 >60  >60   CALCIUM Latest Ref Range: 8.6 - 10.3 mg/dL 9.7 9.0  9.4   MAGNESIUM Latest Ref Range: 1.6 - 2.6 mg/dL    2.1   TOTAL PROTEIN Latest Ref Range: 6.4 - 8.3 g/dL 7.4   7.3   ALBUMIN Latest Ref Range: 3.5 - 5.0 g/dL 4.4   4.4   BILIRUBIN, TOTAL Latest Ref Range: 0.3 - 1.2 mg/dL 0.7   0.6   AST (SGOT) Latest Ref Range: 15 - 41 U/L 19   28   ALT (SGPT) Latest Ref Range: 14 - 54 U/L 12 (L)   19   ALKALINE PHOSPHATASE Latest Ref Range: 38 - 126 U/L 89   96   A/G RATIO Latest Ref Range: 0.8 - 2.0  1.5   1.5   VITAMIN B12 Latest Ref Range: 180 - 914 pg/mL    796   ECG 12-LEAD Unknown   Rpt    .    Past Medical History:  Past Medical History:   Diagnosis Date   . Anesthesia  complication     O2 level dropped   . Arthritis    . Chronic obstructive pulmonary disease    . Cigarette smoker    . Depression    . Esophageal reflux     sleeps on side   . Hyperkalemia     will repeat labs 05/01/18   . Hyperlipidemia    . Neurofibromatosis (CMS [place])    . 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 Problems with swallowing    . Shortness of breath     with moderate exertion           Past Surgical History:  Past Surgical History:   Procedure Laterality Date   . HX CHOLECYSTECTOMY  2011   . HX EYE SURGERY      Eye lid reduction   . HX HYSTERECTOMY  07/2011    FULL HYSTERECTOMY   . 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 PB COLONOSCOPY,DIAGNOSTIC N/A 04/18/2013    COLONOSCOPY performed by Levie Heritage, MD at [facility] ([place])   . SKIN SURGERY  2015    Excisional biopsy of neurofibromatosis skin lesions of the right shoulder, right back and right upper quadrant abdomen           Family History:  Family Medical History:     Problem Relation (Age of Onset)    Alcohol abuse Mother, Sister    Breast Cancer Maternal Grandmother    Cancer Father, Maternal Aunt, Maternal Uncle, Maternal Grandmother, Maternal Uncle, Maternal Uncle    Coronary Artery Disease Father    Diabetes Sister    Heart Attack  Father    Hypertension (High Blood Pressure) Mother    Other Sister    Thyroid Disease Sister              Social History:   Social History     Socioeconomic History   . Marital status: Widowed     Spouse name: Not on file   . Number of children: Not on file   . Years of education: Not on file   . Highest education level: Not on file   Tobacco Use   . Smoking status: Current Every Day Smoker     Packs/day: 1.00     Years: 35.00     Pack years: 35.00     Types: Cigarettes     Last attempt to quit: 03/19/2016     Years since quitting: 2.1   . Smokeless tobacco: Never Used   Substance and Sexual Activity   . Alcohol use: No     Alcohol/week: 0.0 standard drinks   . Drug use: No   . Sexual  activity: Never   Other Topics Concern   . Ability to Walk 1 Flight of Steps without SOB/CP Yes     Comment: denies CP, reports slight DOE   . Ability to Walk 2 Flight of Steps without SOB/CP No     Comment: sob   . Ability To Do Own ADL's Yes   . Caffeine Concern Yes   . Right hand dominant Yes       Allergies:  Allergies   Allergen Reactions   . Codeine Hives/ Urticaria       Medications:  Current Outpatient Medications   Medication Sig   . albuterol sulfate (PROAIR HFA) 90 mcg/actuation Inhalation HFA Aerosol Inhaler Take 1-2 Puffs by inhalation Every 6 hours as needed   . ALPRAZolam (XANAX) 0.25 mg Oral Tablet Take 1 Tab (0.25 mg total) by mouth Three times a day as needed for Insomnia or Anxiety   . atorvastatin (LIPITOR) 10 mg Oral Tablet Take 1 Tab (10 mg total) by mouth Every evening   . azithromycin (ZITHROMAX) 250 mg Oral Tablet Take 500 mg (2 tab) on day 1; take 250 mg (1 tab) on days 2-5.   . cyanocobalamin (VITAMIN B 12) 1,000 mcg Oral Tablet Take 1,000 mcg by mouth Once a day   . cyclobenzaprine (FLEXERIL) 5 mg Oral Tablet Take 1 Tab (5 mg total) by mouth Three times a day as needed for Muscle spasms   . ergocalciferol, vitamin D2, (DRISDOL) 50,000 unit Oral Capsule Take 1 Cap (50,000 Units total) by mouth Every 7 days (Patient not taking: Reported on 05/12/2018)   . Garlic Oral Capsule Take by mouth Once a day   . ipratropium-albuterol 0.5 mg-3 mg(2.5 mg base)/3 mL Solution for Nebulization 3 mL by Nebulization route Four times a day   . meloxicam (MOBIC) 15 mg Oral Tablet TAKE 1 TABLET(15 MG) BY MOUTH EVERY DAY   . MULTIVITS-MIN/IRON/FA/LUTEIN (CENTRUM SILVER WOMEN ORAL) Take 1 Tab by mouth Once a day   . OMEGA-3S/DHA/EPA/FISH OIL (OMEGA 3 ORAL) Take by mouth Once a day   . pantoprazole (PROTONIX) 40 mg Oral Tablet, Delayed Release (E.C.) Take 1 Tab (40 mg total) by mouth Once a day Before breakfast   . PARoxetine (PAXIL) 40 mg Oral Tablet TAKE 1 TABLET(40 MG) BY MOUTH EVERY DAY   . predniSONE  (DELTASONE) 20 mg Oral Tablet Take 2 Tabs (40 mg total) by mouth Once a day for 3 days   .  raNITIdine (ZANTAC) 300 mg Oral Tablet Take 1 Tab (300 mg total) by mouth Every night   . scopolamine (TRANSDERM SCOP) 1 mg over 3 days Transdermal patch 3 day 1 Patch by Transdermal route Every 72 hours       Problem List:  Patient Active Problem List   Diagnosis   . Neurofibromatosis 2 (CMS [place])   . COPD (chronic obstructive pulmonary disease) (CMS [facility])   . Anxiety and depression   . Hiatal hernia   . GERD (gastroesophageal reflux disease)   . Arthritis   . Well woman exam   . Restless legs syndrome (RLS)   . Tension headache, chronic   . Piriformis syndrome of right side   . Strain of gluteus medius of right lower extremity   . Hot flash, menopausal   . Hyperlipidemia   . Fatigue   . Low serum cortisol level (CMS [facility])   . Problems with swallowing   . 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 Tobacco abuse       Review of Systems:  Constitutional: negative for fevers, chills and weight loss  Eyes: negative for visual disturbance  Ears, nose, mouth, throat, and face: negative for hearing loss, earaches and hoarseness  Respiratory:postive for cough negative for dyspnea on exertion  Cardiovascular: negative for chest pressure/discomfort, palpitations, near-syncope, syncope  Gastrointestinal: negative for dysphagia, nausea, vomiting, diarrhea, constipation, abdominal pain and blood in bowel movements  Genitourinary:negative for frequency, dysuria and hematuria. No sexual concerns or vaginal irritation.    Integument/breast: negative for rash and changed mole  Hematologic/lymphatic: negative for lymphadenopathy  Musculoskeletal:negative for myalgias, arthralgias and muscle weakness  Neurological: negative for seizures and stroke  or TIA symptoms  Behavioral/Psych:  PHQ2 completed  Endocrine: negative for diabetic symptoms including polyuria, polydipsia and weight loss  Allergic/Immunologic: negative    Exam:  General Vitals: BP  124/84   Pulse 73   Temp 36.6 C (97.9 F) (Oral)   Resp 16   Ht 1.575 m (5\' 2" )   Wt 54.7 kg (120 lb 8 oz)   BMI 22.04 kg/m   General: appears chronically ill, appears stated age and no distress  Eyes: Pupils equal and round, reactive to light and accomodation. , Conjunctivae/corneas clear, EOM's intact.    HENT:  TMs dull bilaterally,  Nose without erythema. , Mouth mucous membranes moist , Pharynx without injection or exudate. +cobblestoning , No oral lesions.   Neck: No JVD or thyromegaly or lymphadenopathy and thyroid: not enlarged, symmetric, no tenderness/mass/nodules  Lungs: coarse BS over the large bronchi, scattered expiratory wheezes throughout  Cardiovascular:    Heart regular rate and rhythm, S1, S2 normal, no murmur, click, rub or gallop  Abdomen: soft, non-tender, non-distended, no hepatosplenomegaly, no masses and no hernias  Extremities: no edema, redness or tenderness in the calves or thighs,  Skin: Skin color, texture, turgor normal. No rashes. Diffuse fibromas  Neurologic: gait is normal, CN II - XII grossly intact  and alert and oriented x3  Lymphatics: no lymphadenopathy  Psychiatric: affect normal, behavior normal, thought content normal, judgement normal     Assessment and Plan:    ICD-10-CM    1. Hyperkalemia E87.5 BMP  Eliminate high K+ foods from the diet.  K+ is trending down to normal but will follow  Discussed Mobic as a possible source of elevated K+, if her K+ returns elevated again, will consider stopping the medication.   2. Hiatal hernia K44.9 Continue per GI recommendations  No change  to current medications  Eat slowly and chew food thoroughly  Avoid late meals  Limit portion sizes   3. GERD (gastroesophageal reflux disease) K21.9 See plan above   4. Anxiety and depression F41.9 Well controlled currently, but will treat acute travel anxiety with ALPRAZolam (XANAX) 0.25 mg Oral Tablet, take 1 tab po q 8 hours for flight anxiety.  DO not drink EtOH with this medication is  discussed    F32.9    5. Neurofibromatosis 2 (CMS [facility]) Q85.02 No change to current therapy   6. Tobacco abuse Z72.0 Smoking cessation discussed.  Recommend [id number]   7. Elevated random blood glucose level R73.09 HGA1C (HEMOGLOBIN A1C WITH EST AVG GLUCOSE)   8. COPD with acute exacerbation (CMS [facility]) J44.1 XR CHEST PA AND LATERAL     predniSONE (DELTASONE) 20 mg Oral Tablet, take 2 tabs po once daily x 3 days     azithromycin (ZITHROMAX) 250 mg Oral Tablet as directed     ipratropium-albuterol 0.5 mg-3 mg(2.5 mg base)/3 mL Solution for Nebulization, clinic administrered  STOP SMOKING   9. Anxiety F41.9 ALPRAZolam (XANAX) 0.25 mg Oral Tablet take 1 tab po q 8 hours for flight anxiety   10. Motion sickness T75.3XXA scopolamine (TRANSDERM SCOP) 1 mg over 3 days Transdermal patch 3 day  Proper use of this medication is discussed with the patient.    Adverse effects discussed     Tobacco cessation counseling performed.         Return if symptoms worsen or fail to improve.    Fabio Pierce, PA  Collaborative/Supervising physician Darrelyn Hillock, MD

## 2018-05-12 ENCOUNTER — Ambulatory Visit (INDEPENDENT_AMBULATORY_CARE_PROVIDER_SITE_OTHER): Payer: Medicaid Other

## 2018-05-12 ENCOUNTER — Encounter (INDEPENDENT_AMBULATORY_CARE_PROVIDER_SITE_OTHER): Payer: Self-pay

## 2018-05-12 ENCOUNTER — Ambulatory Visit: Payer: Medicaid Other

## 2018-05-12 ENCOUNTER — Ambulatory Visit
Admission: RE | Admit: 2018-05-12 | Discharge: 2018-05-12 | Disposition: A | Discharge status: Home / Self Care | Payer: Medicaid Other | Source: Ambulatory Visit

## 2018-05-12 VITALS — BP 124/84 | HR 73 | Temp 97.9°F | Resp 16 | Ht 62.0 in | Wt 120.5 lb

## 2018-05-12 DIAGNOSIS — R7309 Other abnormal glucose: Secondary | ICD-10-CM

## 2018-05-12 DIAGNOSIS — Q8502 Neurofibromatosis, type 2: Secondary | ICD-10-CM

## 2018-05-12 DIAGNOSIS — F419 Anxiety disorder, unspecified: Secondary | ICD-10-CM

## 2018-05-12 DIAGNOSIS — T753XXA Motion sickness, initial encounter: Secondary | ICD-10-CM

## 2018-05-12 DIAGNOSIS — F17210 Nicotine dependence, cigarettes, uncomplicated: Secondary | ICD-10-CM

## 2018-05-12 DIAGNOSIS — E875 Hyperkalemia: Secondary | ICD-10-CM

## 2018-05-12 DIAGNOSIS — Z720 Tobacco use: Secondary | ICD-10-CM

## 2018-05-12 DIAGNOSIS — J441 Chronic obstructive pulmonary disease with (acute) exacerbation: Secondary | ICD-10-CM | POA: Insufficient documentation

## 2018-05-12 DIAGNOSIS — K219 Gastro-esophageal reflux disease without esophagitis: Secondary | ICD-10-CM

## 2018-05-12 DIAGNOSIS — F32A Depression, unspecified: Secondary | ICD-10-CM

## 2018-05-12 DIAGNOSIS — Z6822 Body mass index (BMI) 22.0-22.9, adult: Secondary | ICD-10-CM

## 2018-05-12 DIAGNOSIS — F329 Major depressive disorder, single episode, unspecified: Secondary | ICD-10-CM

## 2018-05-12 DIAGNOSIS — R918 Other nonspecific abnormal finding of lung field: Secondary | ICD-10-CM

## 2018-05-12 DIAGNOSIS — K449 Diaphragmatic hernia without obstruction or gangrene: Secondary | ICD-10-CM

## 2018-05-12 MED ORDER — PREDNISONE 20 MG TABLET
40.0000 mg | ORAL_TABLET | Freq: Every day | ORAL | 0 refills | Status: AC
Start: 2018-05-12 — End: 2018-05-15

## 2018-05-12 MED ORDER — SCOPOLAMINE 1 MG OVER 3 DAYS TRANSDERMAL PATCH
1.00 | MEDICATED_PATCH | TRANSDERMAL | 0 refills | Status: AC
Start: 2018-05-12 — End: ?

## 2018-05-12 MED ORDER — ALPRAZOLAM 0.25 MG TABLET
0.2500 mg | ORAL_TABLET | Freq: Three times a day (TID) | ORAL | 0 refills | Status: AC | PRN
Start: 2018-05-12 — End: ?

## 2018-05-12 MED ORDER — AZITHROMYCIN 250 MG TABLET
ORAL_TABLET | ORAL | 0 refills | Status: DC
Start: 2018-05-12 — End: 2018-06-22

## 2018-05-12 MED ORDER — IPRATROPIUM 0.5 MG-ALBUTEROL 3 MG (2.5 MG BASE)/3 ML NEBULIZATION SOLN
3.00 mL | INHALATION_SOLUTION | Freq: Four times a day (QID) | RESPIRATORY_TRACT | 0 refills | Status: AC
Start: 2018-05-12 — End: ?

## 2018-05-12 NOTE — Patient Instructions (Signed)
How much potassium does fruit contain?  The amount of potassium in milligrams (mg) contained in each fruit or serving of fruit is listed beside the item.  High-potassium foods (more than 200 mg per serving):   1 medium banana (425)    of a papaya (390)    cup of prune juice (370)    cup of raisins (270)   1 medium mango (325) or kiwi (240)   1 small orange (240) or  cup of orange juice (235)    cup of cubed cantaloupe (215) or diced honeydew melon (200)   1 medium pear (200)  Medium-potassium foods (50 to 200 mg per serving):   1 medium peach (185)   1 small apple or  cup of apple juice (150)    cup of peaches canned in juice (120)    cup of canned pineapple (100)    cup of fresh, sliced strawberries (161)    cup of watermelon (85)  Low-potassium foods (less than 50 mg per serving):    cup of cranberries (45) or cranberry juice cocktail (20)    cup of nectar of papaya, mango, or pear (35)  How much potassium do vegetables contain?  High-potassium foods (more than 200 mg per serving):   1 medium baked potato, with skin (925)   1 baked medium sweet potato, with skin (450)    cup of tomato or vegetable juice (275), or 1 medium raw tomato (290)    cup of mushrooms (280)    cup of fresh brussels sprouts (250)    cup of cooked zucchini (220) or winter squash (250)    of a medium avocado (245)    cup of broccoli (230)  Medium-potassium foods (50 to 200 mg per serving):    cup of corn (195)    cup of fresh or cooked carrots (180)    cup of fresh cauliflower (150)    cup of asparagus (155)    cup of canned peas (90)   1 cup of lettuce, all types (100)    cup of fresh green beans (90)    cup of frozen green beans (85)    cup of cucumber (80)  1. CareNotes  2. Potassium Content of Foods List  Print Share     WHAT YOU NEED TO KNOW:  What is potassium?  Potassium is a mineral that is found in most foods. Potassium helps to balance fluids and minerals in your body. It also helps your body maintain a  normal blood pressure. Potassium helps your muscles contract and your nerves function normally.  Why do I need to change the amount of potassium I eat?  You may need more potassium if you have hypokalemia (low potassium levels) or high blood pressure. You may also need more potassium if you are taking diuretics. Diuretics and certain medicines cause your body to lose potassium.   You may need less potassium in your diet if you have hyperkalemia (high potassium levels) or kidney disease.  How much potassium does fruit contain?  The amount of potassium in milligrams (mg) contained in each fruit or serving of fruit is listed beside the item.  High-potassium foods (more than 200 mg per serving):   1 medium banana (425)    of a papaya (390)    cup of prune juice (370)    cup of raisins (270)   1 medium mango (325) or kiwi (240)   1 small orange (240) or  cup of orange  juice (235)    cup of cubed cantaloupe (215) or diced honeydew melon (200)   1 medium pear (200)  Medium-potassium foods (50 to 200 mg per serving):   1 medium peach (185)   1 small apple or  cup of apple juice (150)    cup of peaches canned in juice (120)    cup of canned pineapple (100)    cup of fresh, sliced strawberries (299)    cup of watermelon (85)  Low-potassium foods (less than 50 mg per serving):    cup of cranberries (45) or cranberry juice cocktail (20)    cup of nectar of papaya, mango, or pear (35)  How much potassium do vegetables contain?  High-potassium foods (more than 200 mg per serving):   1 medium baked potato, with skin (925)   1 baked medium sweet potato, with skin (450)    cup of tomato or vegetable juice (275), or 1 medium raw tomato (290)    cup of mushrooms (280)    cup of fresh brussels sprouts (250)    cup of cooked zucchini (220) or winter squash (250)    of a medium avocado (245)    cup of broccoli (230)  Medium-potassium foods (50 to 200 mg per serving):    cup of corn (195)    cup of fresh or cooked  carrots (180)    cup of fresh cauliflower (150)    cup of asparagus (155)    cup of canned peas (90)   1 cup of lettuce, all types (100)    cup of fresh green beans (90)    cup of frozen green beans (85)    cup of cucumber (80)  How much potassium do protein foods contain?  High-potassium foods (more than 200 mg per serving):    cup of cooked pinto beans (400) or lentils (365)   1 cup of soy milk (300)   3 ounces of baked or broiled salmon (319)   3 ounces of roasted [place], dark meat (250)    cup of sunflower seeds (241)   3 ounces of cooked lean beef (224)   2 tablespoons of smooth peanut butter (210)  Medium-potassium foods (50 to 200 mg per serving):   1 ounce of salted peanuts, almonds, or cashews (200)   1 large egg (60)  How much potassium do dairy foods contain?  High-potassium foods (more than 200 mg per serving):   6 ounces of yogurt (260 to 435)   1 cup of nonfat, low-fat, or whole milk (350 to 380)  Medium-potassium foods (50 to 200 mg per serving):    cup of ricotta cheese (154)    cup of vanilla ice cream (131)    cup of low-fat (2%) cottage cheese (110)  Low-potassium foods (less than 50 mg per serving):   1 ounce of cheese (20 to 30)  How much potassium do grains contain?  1 slice of white bread (30)    cup of white or brown rice (50)    cup of spaghetti or macaroni (30)   1 flour or corn tortilla (50)   1 four-inch waffle (50)  What other foods contain potassium?  1 tablespoon of molasses (295)   1 ounces of chocolate (165)   Some salt substitutes may contain a high amount of potassium. Check the food label to find the amount of potassium it contains.

## 2018-05-12 NOTE — Nursing Note (Signed)
Chief Complaint:   Chief Complaint            Lab Results follow up        [place]        Vital Signs  BP 124/84   Pulse 73   Temp 36.6 C (97.9 F) (Oral)   Resp 16   Ht 1.575 m (5\' 2" )   Wt 54.7 kg (120 lb 8 oz)   BMI 22.04 kg/m       Social History     Tobacco Use   Smoking Status Current Every Day Smoker   . Packs/day: 1.00   . Years: 35.00   . Pack years: 35.00   . Types: Cigarettes   . Last attempt to quit: 03/19/2016   . Years since quitting: 2.1   Smokeless Tobacco Never Used     Allergies  Allergies   Allergen Reactions   . Codeine Hives/ Urticaria     Medication History  Reviewed for OTC medication and any new medications, provider will review medication history  Care Team  Patient Care Team:  Fabio Pierce, [place] as PCP - General ([place])  Immunizations - last 24 hours     None        Cecilio Asper, LPN  68/10/7288, 21:11

## 2018-05-12 NOTE — Nursing Note (Signed)
05/12/18 1000   Medication Administration   Initials KLD   Other Medication DUO NEB   Medication Dose 3ML   Route of Administration NEBULIZER   NDC # [id number]   LOT # [id number]   Expiration date 04/08/19   Manufacturer [place] Yes   Patient Supplied No

## 2018-05-14 LAB — HGA1C (HEMOGLOBIN A1C WITH EST AVG GLUCOSE)
ESTIMATED AVERAGE GLUCOSE: 111 mg/dL
HEMOGLOBIN A1C: 5.5 % (ref 4.0–5.6)

## 2018-05-15 ENCOUNTER — Other Ambulatory Visit (INDEPENDENT_AMBULATORY_CARE_PROVIDER_SITE_OTHER): Payer: Self-pay

## 2018-05-15 DIAGNOSIS — R9389 Abnormal findings on diagnostic imaging of other specified body structures: Secondary | ICD-10-CM

## 2018-05-15 DIAGNOSIS — R05 Cough: Secondary | ICD-10-CM

## 2018-05-15 DIAGNOSIS — R059 Cough, unspecified: Secondary | ICD-10-CM

## 2018-05-16 ENCOUNTER — Other Ambulatory Visit (INDEPENDENT_AMBULATORY_CARE_PROVIDER_SITE_OTHER): Payer: Self-pay

## 2018-05-16 DIAGNOSIS — E875 Hyperkalemia: Secondary | ICD-10-CM

## 2018-05-17 ENCOUNTER — Other Ambulatory Visit (HOSPITAL_BASED_OUTPATIENT_CLINIC_OR_DEPARTMENT_OTHER): Payer: Self-pay

## 2018-05-17 ENCOUNTER — Telehealth (HOSPITAL_BASED_OUTPATIENT_CLINIC_OR_DEPARTMENT_OTHER): Payer: Self-pay

## 2018-05-17 ENCOUNTER — Telehealth (INDEPENDENT_AMBULATORY_CARE_PROVIDER_SITE_OTHER): Payer: Self-pay

## 2018-05-17 ENCOUNTER — Ambulatory Visit: Payer: Medicaid Other

## 2018-05-17 DIAGNOSIS — E875 Hyperkalemia: Secondary | ICD-10-CM | POA: Insufficient documentation

## 2018-05-17 LAB — BASIC METABOLIC PANEL
ANION GAP: 8 mmol/L (ref 3–11)
BUN/CREA RATIO: 22 (ref 6–22)
BUN: 20 mg/dL (ref 6–20)
CALCIUM: 9.3 mg/dL (ref 8.6–10.3)
CO2 TOTAL: 28 mmol/L (ref 22–32)
CREATININE: 0.9 mg/dL (ref 0.44–1.00)
ESTIMATED GFR: >60 mL/min/1.73mˆ2 (ref >60)
GLUCOSE: 143 mg/dL — ABNORMAL HIGH (ref 70–110)
POTASSIUM: 4.2 mmol/L (ref 3.4–5.1)
SODIUM: 139 mmol/L (ref 136–145)

## 2018-05-17 MED ORDER — FAMOTIDINE 40 MG TABLET
40.00 mg | ORAL_TABLET | Freq: Every evening | ORAL | 5 refills | Status: AC
Start: 2018-05-17 — End: ?

## 2018-05-17 NOTE — Progress Notes (Signed)
[name],    Your A1C was normal.  PLease contact our office with any questions.    Sincerely,    Desiree Hane, LPN  90/10/147, 96:92

## 2018-05-17 NOTE — Telephone Encounter (Signed)
I apoligized to pt as her EGD is not scheduledd in fluoro room tomorrow as was requested  so need to reschedule  also told her to stop zantac and use Pepcid 40 mg at night in its place ,Pepcid was escribed.

## 2018-05-17 NOTE — Telephone Encounter (Signed)
-----   Message from Eudelia Bunch, MD sent at 05/16/2018  7:01 PM EDT -----  Pt needs basic metabolic panel done asap Wednesday as last potassium was elevated  Before pt has colonoscopy 05-18-18

## 2018-05-17 NOTE — Telephone Encounter (Signed)
Spoke with patient, she is on her way to [facility] to have labs completed.    Dewaine Oats, MA  05/17/2018, 09:04

## 2018-05-18 ENCOUNTER — Encounter (HOSPITAL_BASED_OUTPATIENT_CLINIC_OR_DEPARTMENT_OTHER): Admission: RE | Payer: Self-pay | Source: Ambulatory Visit

## 2018-05-18 ENCOUNTER — Telehealth (INDEPENDENT_AMBULATORY_CARE_PROVIDER_SITE_OTHER): Payer: Self-pay

## 2018-05-18 ENCOUNTER — Encounter (INDEPENDENT_AMBULATORY_CARE_PROVIDER_SITE_OTHER): Payer: Self-pay

## 2018-05-18 ENCOUNTER — Ambulatory Visit (HOSPITAL_BASED_OUTPATIENT_CLINIC_OR_DEPARTMENT_OTHER): Admission: RE | Admit: 2018-05-18 | Payer: Medicaid Other | Source: Ambulatory Visit

## 2018-05-18 SURGERY — GASTROSCOPY WITH BIOPSY
Anesthesia: IV General

## 2018-05-22 ENCOUNTER — Telehealth (INDEPENDENT_AMBULATORY_CARE_PROVIDER_SITE_OTHER): Payer: Self-pay

## 2018-05-22 ENCOUNTER — Ambulatory Visit
Admission: RE | Admit: 2018-05-22 | Discharge: 2018-05-22 | Disposition: A | Discharge status: Home / Self Care | Payer: Medicaid Other | Source: Ambulatory Visit

## 2018-05-22 DIAGNOSIS — R918 Other nonspecific abnormal finding of lung field: Secondary | ICD-10-CM | POA: Insufficient documentation

## 2018-05-22 DIAGNOSIS — R9389 Abnormal findings on diagnostic imaging of other specified body structures: Secondary | ICD-10-CM | POA: Insufficient documentation

## 2018-05-22 DIAGNOSIS — R05 Cough: Secondary | ICD-10-CM | POA: Insufficient documentation

## 2018-05-22 DIAGNOSIS — R059 Cough, unspecified: Secondary | ICD-10-CM

## 2018-05-22 NOTE — Telephone Encounter (Signed)
Called patient to confirm date of procedure. PT asked if blood work will need repeated due to changed date. It is over 30 days, advised I will check with Dr. Lovett Calender and get back to her.    Please advise     .[place], [place]  05/22/2018, 13:41

## 2018-05-22 NOTE — Telephone Encounter (Signed)
LM for patient, per Dr. Lovett Calender:   Alexa Bunch, MD  You 4 minutes ago (1:44 PM)      New labs not needed      .[place], [place]  05/22/2018, 13:49

## 2018-05-23 ENCOUNTER — Other Ambulatory Visit (INDEPENDENT_AMBULATORY_CARE_PROVIDER_SITE_OTHER): Payer: Self-pay

## 2018-05-23 DIAGNOSIS — R9389 Abnormal findings on diagnostic imaging of other specified body structures: Secondary | ICD-10-CM

## 2018-05-23 DIAGNOSIS — R059 Cough, unspecified: Secondary | ICD-10-CM

## 2018-05-23 DIAGNOSIS — M1990 Unspecified osteoarthritis, unspecified site: Secondary | ICD-10-CM

## 2018-05-23 DIAGNOSIS — R05 Cough: Secondary | ICD-10-CM

## 2018-05-23 DIAGNOSIS — E559 Vitamin D deficiency, unspecified: Secondary | ICD-10-CM

## 2018-05-27 ENCOUNTER — Other Ambulatory Visit (INDEPENDENT_AMBULATORY_CARE_PROVIDER_SITE_OTHER): Payer: Self-pay

## 2018-05-27 DIAGNOSIS — E785 Hyperlipidemia, unspecified: Secondary | ICD-10-CM

## 2018-05-29 ENCOUNTER — Telehealth (INDEPENDENT_AMBULATORY_CARE_PROVIDER_SITE_OTHER): Payer: Self-pay

## 2018-05-29 NOTE — Telephone Encounter (Signed)
Spoke with Angie at pharmacy, clarification provided. Spoke with patient advised medication should be ready for pick up today.    Dewaine Oats, MA  05/29/2018, 09:30

## 2018-06-05 ENCOUNTER — Other Ambulatory Visit (INDEPENDENT_AMBULATORY_CARE_PROVIDER_SITE_OTHER): Payer: Self-pay

## 2018-06-05 DIAGNOSIS — J449 Chronic obstructive pulmonary disease, unspecified: Secondary | ICD-10-CM

## 2018-06-14 NOTE — H&P (Signed)
Alexa Bunch, MD   Physician   GASTROENTEROLOGY   Progress Notes   Signed   Encounter Date:  04/19/2018               Expand All Collapse All      [] Hide copied text    [] Hover for details     [facility]  [place]  [place] [address]        Name:            [name] MRN:              [id number]   Date:              04/19/2018 Age:            57 y.o.      Requesting Physician: Fabio Pierce, PA  History of Present Illness  [name] is a 57 y.o. female who presents with a chief complaint of Esophageal Reflux   to clinic.Pt had esophagram 10-14-2017 Small hiatal hernia ,mild distal esophageal stricture and reflux the patient had screening colonoscopy with Dr.W Kenton Kingfisher 04/2013 and was reported as normal. 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     Pt has over a year of intermittent dysphagia to solids.She has heartburn helped with zantac but not completely.No melena or brbpr.No past EGD.  Past History       Current Outpatient Medications   Medication Sig   . albuterol sulfate (PROAIR HFA) 90 mcg/actuation Inhalation HFA Aerosol Inhaler Take 1-2 Puffs by inhalation Every 6 hours as needed   . atorvastatin (LIPITOR) 10 mg Oral Tablet Take 1 Tab (10 mg total) by mouth Every evening   . cyanocobalamin (VITAMIN B 12) 1,000 mcg Oral Tablet Take 1,000 mcg by mouth Once a day   . cyclobenzaprine (FLEXERIL) 5 mg Oral Tablet Take 1 Tab (5 mg total) by mouth Three times a day as needed for Muscle spasms   . ergocalciferol, vitamin D2, (DRISDOL) 50,000 unit Oral Capsule Take 1 Cap (50,000 Units total) by mouth Every 7 days   . Garlic Oral Capsule Take by mouth Once a day   . meloxicam (MOBIC) 15 mg Oral Tablet TAKE 1 TABLET(15 MG) BY MOUTH EVERY DAY   . MULTIVITS-MIN/IRON/FA/LUTEIN (CENTRUM SILVER WOMEN ORAL) Take 1 Tab by mouth Once a day   . OMEGA-3S/DHA/EPA/FISH OIL (OMEGA 3 ORAL) Take by mouth Once a day   . pantoprazole (PROTONIX) 40 mg  Oral Tablet, Delayed Release (E.C.) Take 1 Tab (40 mg total) by mouth Once a day Before breakfast   . PARoxetine (PAXIL) 40 mg Oral Tablet TAKE 1 TABLET(40 MG) BY MOUTH EVERY DAY   . raNITIdine (ZANTAC) 300 mg Oral Tablet Take 1 Tab (300 mg total) by mouth Every night           Allergies   Allergen Reactions   . Codeine Hives/ Urticaria           Past Medical History:   Diagnosis Date   . Anesthesia complication       O2 level dropped   . Chronic obstructive pulmonary disease     . Depression     . Neurofibromatosis (CMS [place])     . Shortness of breath                    Past Surgical History:   Procedure Laterality Date   . HX CHOLECYSTECTOMY   2011   .  HX EYE SURGERY         Eye lid reduction   . HX HYSTERECTOMY   07/2011     FULL HYSTERECTOMY   . 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 PB COLONOSCOPY,DIAGNOSTIC N/A 04/18/2013     COLONOSCOPY performed by Levie Heritage, MD at [facility] ([place])            Family History       Family Medical History:         Problem Relation (Age of Onset)     Alcohol abuse Mother, Sister     Breast Cancer Maternal Grandmother     Cancer Father, Maternal Aunt, Maternal Uncle, Maternal Grandmother, Maternal Uncle, Maternal Uncle     Coronary Artery Disease Father     Diabetes Sister     Heart Attack Father     Hypertension (High Blood Pressure) Mother     Other Sister     Thyroid Disease Sister                   Social History  Social History            Socioeconomic History   . Marital status: Widowed       Spouse name: Not on file   . Number of children: Not on file   . Years of education: Not on file   . Highest education level: Not on file   Tobacco Use   . Smoking status: Current Every Day Smoker       Packs/day: 1.00       Years: 35.00       Pack years: 35.00       Types: Cigarettes       Last attempt to quit: 03/19/2016       Years since quitting: 2.0   . Smokeless tobacco: Never Used   Substance and Sexual Activity   . Alcohol use: No        Alcohol/week: 0.0 standard drinks   . Drug use: No   . Sexual activity: Never   Other Topics Concern   . Ability to Walk 1 Flight of Steps without SOB/CP Yes   . Ability to Walk 2 Flight of Steps without SOB/CP No       Comment: sob   . Ability To Do Own ADL's Yes   . Caffeine Concern Yes   . Right hand dominant Yes      Review of Systems     Constitutional: negative  Eyes: negative  Ears, nose, mouth, throat, and face: negative  Respiratory: negative  Cardiovascular: negative  Gastrointestinal: positive for dysphagia and reflux symptoms, negative for odynophagia, dyspepsia, nausea, vomiting, change in bowel habits, melena, diarrhea, constipation, abdominal pain and jaundice  Genitourinary:negative  Integument/breast: positive for neurofibromas on skin  Hematologic/lymphatic: negative  Musculoskeletal:negative  Neurological: negative  Behavioral/Psych: negative  Endocrine: negative  Allergic/Immunologic: see allergy list     Examination  Vitals: BP 109/67   Pulse 87   Temp 37.2 C (99 F) (Tympanic)   Ht 1.575 m (5\' 2" )   Wt 53.5 kg (118 lb)   BMI 21.58 kg/m      General: no distress  Eyes: Conjunctiva clear.  Neck: supple, symmetrical, trachea midline  Lungs: clear to auscultation bilaterally.   Cardiovascular:    Heart regular rate and rhythm  Abdomen: soft, non-tender  Extremities: no cyanosis or edema  Skin:  Skin warm and dry  Neurologic: grossly normal  Lymphatics: no supraclavicular  lymphadenopathy  Psychiatric: affect normal  Impression  1. GERD (gastroesophageal reflux disease)    2. Hiatal hernia    3. Dysphagia    4. Esophageal stricture    5. Medication monitoring encounter       Recommendations:      Orders Placed This Encounter   . CBC/DIFF   . COMPREHENSIVE METABOLIC PROFILE - [facility] ONLY   . PT/INR   . MAGNESIUM   . VITAMIN B12   . [facility] ENDOSCOPY REQUEST (AMB)   . pantoprazole (PROTONIX) 40 mg Oral Tablet, Delayed Release (E.C.)   . raNITIdine (ZANTAC) 300 mg Oral Tablet      Patient  Instructions   Eat slowly avoid tough meats and dry breads.     Measures To Help Decrease Reflux-Heartburn      Elevate head of the bed six inches   Small frequent meals   Avoid late night meals   Avoid excess mints, peppermints, chocolate, raw onions, garlic, alcohol, fatty foods, coffee, tea, tomatoe and citric products   Avoid Smoking           Start Protonix 40 mg in morning before breakfast a strong acid reducer and take zantac 300 mg nightly either before dinner oir before bedtime.     Set up upper endoscopy to do in Hospital on a Thursday in room where x ray can be done with possible savory dilatation.The risks of upper endoscopy include infection,bleeding,perforation,sedation-anesthesia reaction,respiratory and/or cardiac compromise.Hold aspirin and NSAID's  Like YOUR MOBIC, motrin,alleve,nuprin ibuprofen,etc for 1 week prior to and after endoscopic procedure(s) unless taking for specific heart or neurologic reason as they thin blood.        Get blood tests 1-2 weeks prior to upper endoscopy.     Alexa Bunch, MD       Electronically signed by Alexa Bunch, MD at 04/19/18 1018       Office Visit on 04/19/2018          Routing History          Detailed Report

## 2018-06-22 ENCOUNTER — Encounter (HOSPITAL_BASED_OUTPATIENT_CLINIC_OR_DEPARTMENT_OTHER): Payer: Self-pay

## 2018-06-22 ENCOUNTER — Other Ambulatory Visit (INDEPENDENT_AMBULATORY_CARE_PROVIDER_SITE_OTHER): Payer: Self-pay

## 2018-06-22 ENCOUNTER — Ambulatory Visit (HOSPITAL_BASED_OUTPATIENT_CLINIC_OR_DEPARTMENT_OTHER)
Admission: RE | Admit: 2018-06-22 | Discharge: 2018-06-22 | Disposition: A | Discharge status: Home / Self Care | Payer: Medicaid Other | Source: Ambulatory Visit

## 2018-06-22 ENCOUNTER — Other Ambulatory Visit (HOSPITAL_BASED_OUTPATIENT_CLINIC_OR_DEPARTMENT_OTHER): Payer: Self-pay

## 2018-06-22 ENCOUNTER — Ambulatory Visit (HOSPITAL_BASED_OUTPATIENT_CLINIC_OR_DEPARTMENT_OTHER): Payer: Medicaid Other | Admitting: Certified Registered"

## 2018-06-22 ENCOUNTER — Encounter (HOSPITAL_BASED_OUTPATIENT_CLINIC_OR_DEPARTMENT_OTHER): Admission: RE | Disposition: A | Discharge status: Home / Self Care | Payer: Self-pay | Source: Ambulatory Visit

## 2018-06-22 DIAGNOSIS — K259 Gastric ulcer, unspecified as acute or chronic, without hemorrhage or perforation: Secondary | ICD-10-CM | POA: Insufficient documentation

## 2018-06-22 DIAGNOSIS — M1990 Unspecified osteoarthritis, unspecified site: Secondary | ICD-10-CM

## 2018-06-22 DIAGNOSIS — Z90710 Acquired absence of both cervix and uterus: Secondary | ICD-10-CM | POA: Insufficient documentation

## 2018-06-22 DIAGNOSIS — Z79899 Other long term (current) drug therapy: Secondary | ICD-10-CM | POA: Insufficient documentation

## 2018-06-22 DIAGNOSIS — I780 Hereditary hemorrhagic telangiectasia: Secondary | ICD-10-CM

## 2018-06-22 DIAGNOSIS — F17210 Nicotine dependence, cigarettes, uncomplicated: Secondary | ICD-10-CM | POA: Insufficient documentation

## 2018-06-22 DIAGNOSIS — K210 Gastro-esophageal reflux disease with esophagitis: Secondary | ICD-10-CM

## 2018-06-22 DIAGNOSIS — K222 Esophageal obstruction: Secondary | ICD-10-CM | POA: Insufficient documentation

## 2018-06-22 DIAGNOSIS — Z9049 Acquired absence of other specified parts of digestive tract: Secondary | ICD-10-CM | POA: Insufficient documentation

## 2018-06-22 DIAGNOSIS — I781 Nevus, non-neoplastic: Secondary | ICD-10-CM | POA: Insufficient documentation

## 2018-06-22 DIAGNOSIS — K449 Diaphragmatic hernia without obstruction or gangrene: Secondary | ICD-10-CM | POA: Insufficient documentation

## 2018-06-22 DIAGNOSIS — K219 Gastro-esophageal reflux disease without esophagitis: Secondary | ICD-10-CM | POA: Insufficient documentation

## 2018-06-22 DIAGNOSIS — Z791 Long term (current) use of non-steroidal anti-inflammatories (NSAID): Secondary | ICD-10-CM | POA: Insufficient documentation

## 2018-06-22 DIAGNOSIS — Q8502 Neurofibromatosis, type 2: Secondary | ICD-10-CM | POA: Insufficient documentation

## 2018-06-22 DIAGNOSIS — J449 Chronic obstructive pulmonary disease, unspecified: Secondary | ICD-10-CM | POA: Insufficient documentation

## 2018-06-22 DIAGNOSIS — Z9889 Other specified postprocedural states: Secondary | ICD-10-CM | POA: Insufficient documentation

## 2018-06-22 SURGERY — GASTROSCOPY WITH BIOPSY
Anesthesia: General | Wound class: Clean Contaminated Wounds-The respiratory, GI, Genital, or urinary

## 2018-06-22 MED ORDER — DEXAMETHASONE SODIUM PHOSPHATE 4 MG/ML INJECTION SOLUTION
Freq: Once | INTRAMUSCULAR | Status: DC | PRN
Start: 2018-06-22 — End: 2018-06-22
  Administered 2018-06-22: 8 mg via INTRAVENOUS

## 2018-06-22 MED ORDER — ALBUTEROL SULFATE CONCENTRATE 2.5 MG/0.5 ML SOLUTION FOR NEBULIZATION
2.5000 mg | INHALATION_SOLUTION | Freq: Once | RESPIRATORY_TRACT | Status: DC | PRN
Start: 2018-06-22 — End: 2018-06-22
  Administered 2018-06-22: 2.5 mg via RESPIRATORY_TRACT
  Filled 2018-06-22: qty 1

## 2018-06-22 MED ORDER — LACTATED RINGERS INTRAVENOUS SOLUTION
INTRAVENOUS | Status: DC
Start: 2018-06-22 — End: 2018-06-22
  Administered 2018-06-22: 0 via INTRAVENOUS

## 2018-06-22 MED ORDER — SUCRALFATE 100 MG/ML ORAL SUSPENSION
1.00 g | Freq: Four times a day (QID) | ORAL | 0 refills | Status: AC
Start: 2018-06-22 — End: 2018-07-06

## 2018-06-22 MED ORDER — SUCRALFATE 100 MG/ML ORAL SUSPENSION
1.0000 g | ORAL | Status: AC
Start: 2018-06-22 — End: 2018-06-22
  Administered 2018-06-22: 1 g via ORAL
  Filled 2018-06-22: qty 10

## 2018-06-22 MED ORDER — LIDOCAINE (PF) 100 MG/5 ML (2 %) INTRAVENOUS SYRINGE
INJECTION | Freq: Once | INTRAVENOUS | Status: DC | PRN
Start: 2018-06-22 — End: 2018-06-22
  Administered 2018-06-22: 100 mg via INTRAVENOUS

## 2018-06-22 MED ORDER — ROCURONIUM 10 MG/ML INTRAVENOUS SOLUTION
Freq: Once | INTRAVENOUS | Status: DC | PRN
Start: 2018-06-22 — End: 2018-06-22
  Administered 2018-06-22: 10 mg via INTRAVENOUS

## 2018-06-22 MED ORDER — FENTANYL (PF) 50 MCG/ML INJECTION SOLUTION
Freq: Once | INTRAMUSCULAR | Status: DC | PRN
Start: 2018-06-22 — End: 2018-06-22
  Administered 2018-06-22: 100 ug via INTRAVENOUS

## 2018-06-22 MED ORDER — SUCCINYLCHOLINE CHLORIDE 20 MG/ML INJECTION SOLUTION
Freq: Once | INTRAMUSCULAR | Status: DC | PRN
Start: 2018-06-22 — End: 2018-06-22
  Administered 2018-06-22: 120 mg via INTRAVENOUS

## 2018-06-22 MED ORDER — PROPOFOL 10 MG/ML IV BOLUS
INJECTION | Freq: Once | INTRAVENOUS | Status: DC | PRN
Start: 2018-06-22 — End: 2018-06-22
  Administered 2018-06-22: 100 mg via INTRAVENOUS
  Administered 2018-06-22: 50 mg via INTRAVENOUS

## 2018-06-22 MED ORDER — MIDAZOLAM 1 MG/ML INJECTION SOLUTION
Freq: Once | INTRAMUSCULAR | Status: DC | PRN
Start: 2018-06-22 — End: 2018-06-22
  Administered 2018-06-22: 2 mg via INTRAVENOUS

## 2018-06-22 MED ORDER — DEXMEDETOMIDINE 4 MCG/ML IV DILUTION
Freq: Once | INTRAMUSCULAR | Status: DC | PRN
Start: 2018-06-22 — End: 2018-06-22
  Administered 2018-06-22: 14:00:00 8 ug via INTRAVENOUS

## 2018-06-22 MED ORDER — ONDANSETRON HCL (PF) 4 MG/2 ML INJECTION SOLUTION
Freq: Once | INTRAMUSCULAR | Status: DC | PRN
Start: 2018-06-22 — End: 2018-06-22
  Administered 2018-06-22: 4 mg via INTRAVENOUS

## 2018-06-22 MED ADMIN — nystatin 100,000 unit/gram topical powder: INTRAVENOUS | @ 14:00:00 | NDC 00574200815

## 2018-06-22 MED ADMIN — nicotine 14 mg/24 hr daily transdermal patch: ORAL | @ 15:00:00

## 2018-06-22 SURGICAL SUPPLY — 12 items
BLOCK BITE 60FR BLOX DISP (GENE) ×1 IMPLANT
BLOCK BITE OD60 ENDO (GENE) ×1
DISC USE 136821 - GW ENDOS 200CM SAVARY FLX TP S_NS REUSE (DIAGNOSTIC) ×1
DISCONTINUED USE 136821 - GW ENDOS 200CM SAVARY FLX TP S_NS REUSE (DIAGNOSTIC) ×1 IMPLANT
FORCEPS BIOPSY MICROMESH TTH STREAMLINE CATH NEEDLE 240CM 2.4MM RJ 4 SS LRG CPC STRL DISP ORNG 2.8MM (SURGICAL INSTRUMENTS) ×1 IMPLANT
FORCEPS BIOPSY NEEDLE 240CM 2._4MM RJ 4 2.8MM LRG CPC STRL (INSTRUMENTS) ×1
GW ENDOS 200CM SAVARY FLX TP S NS REUSE (DIAGNOSTIC) ×1
NEEDLE SCLRTX 23GA 2.3MM .32MM CNRST SHEATH INNER CATH STRL DISP INTJCT 4MM 240CM (NEEDLES & SYRINGE SUPPLIES) IMPLANT
NEEDLE SCLRTX 23GA 2.3MM .32MM_INNER CATH CNRST SHTH STRL (NEEDLES & SYRINGE SUPPLIES)
SYRINGE INFLAT ALN II GA STRL DISP 60ML (NEEDLES & SYRINGE SUPPLIES) IMPLANT
SYRINGE LL 30ML LF  STRL CONCEN TIP GRAD N-PYRG DEHP-FR MED DISP (NEEDLES & SYRINGE SUPPLIES) IMPLANT
SYRINGE LL 30ML LF STRL CONCE_N TIP GRAD N-PYRG DEHP-FR MED (NEEDLES & SYRINGE SUPPLIES)

## 2018-06-22 NOTE — OR PostOp (Signed)
Patient discharged home with family. Discharge instructions reviewed with patient and spouse.  A written copy of the AVS and discharge instructions was given to the patient.  Questions sufficiently answered as needed.  Patient encouraged to follow up with PCP as indicated. In the event of an emergency, patient/care giver instructed to call 911 or go to the nearest emergency room. Pt ambulated to vehicle with sister and sister drove home.

## 2018-06-22 NOTE — Anesthesia Preprocedure Evaluation (Addendum)
ANESTHESIA PRE-OP EVALUATION  Planned Procedure: GASTROSCOPY WITH BIOPSY (N/A )  GASTROSCOPY WITH DILATION (N/A Mouth)  Review of Systems  Anesthesia Complications comment: Low O2    anesthesia history negative   no family history of anesthetic complications   patient summary reviewed          Pulmonary   smoked in last 24hours,   Cardiovascular    ECG reviewed  Exercise Tolerance: > or = 4 METS        GI/Hepatic/Renal    GERD and well controlled no PUD and no motion sickness     Endo/Other    no chronic steroid use       Neuro/Psych/MS        Cancer                   Physical Assessment      Patient summary reviewed   Airway       Mallampati: I      Neck ROM: full              Dental           (+) upper dentures, lower dentures           Pulmonary      (+) decreased breath sounds present        Cardiovascular    Rhythm: regular         Other findings  NPO pMN    Contact lenses no  Piercings   no  Hearing aids   no  Has anything changed since you spoke with the preanesthesia nurse? no  Any changes in the last 6 months that have not been documented or reported? No    Coughing    Left lazy eye          Plan  Planned anesthesia type: GETA        ASA 2         Anesthetic plan and risks discussed with patient.             Patient's NPO status is appropriate for Anesthesia.           Plan discussed with CRNA.

## 2018-06-22 NOTE — Anesthesia Transfer of Care (Signed)
ANESTHESIA TRANSFER OF CARE   [name] is a 57 y.o. ,female,     had Procedure(s):  GASTROSCOPY WITH BIOPSY  GASTROSCOPY WITH DILATION  performed  06/22/18   Primary Service: Eudelia Bunch,*    Past Medical History:   Diagnosis Date   . Anesthesia complication     O2 level dropped   . Arthritis    . Chronic obstructive pulmonary disease    . Cigarette smoker    . Depression    . Esophageal reflux     sleeps on side   . Hyperkalemia     will repeat labs 05/01/18   . Hyperlipidemia    . Neurofibromatosis (CMS [place])    . 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 Problems with swallowing    . Shortness of breath     with moderate exertion      Allergy History as of 06/22/18     CODEINE       Noted Status Severity Type Reaction    04/18/13 1024 Augustin Schooling 03/26/13 Active High  Hives/ Urticaria    03/26/13 0908 Audie Box 03/26/13 Active High  Anaphylaxis              I completed my transfer of care / handoff to the receiving personnel during which we discussed:  Access, Airway, All key/critical aspects of case discussed, Analgesia, Antibiotics, Expectation of post procedure, Fluids/Product, Gave opportunity for questions and acknowledgement of understanding, Labs and PMHx    Post Location: PACU                                          Additional Info:Transferred to PACU for phase I recovery in stable condition. Report to Roselyn Reef, Therapist, sports.                      Last OR Temp: Temperature: 37.1 C (98.8 F)  ABG:  POTASSIUM   Date Value Ref Range Status   05/17/2018 4.2 3.4 - 5.1 mmol/L Final   03/26/2013 4.9 3.4 - 5.1 mmol/L Final     CALCIUM   Date Value Ref Range Status   05/17/2018 9.3 8.6 - 10.3 mg/dL Final   03/26/2013 9.6 8.6 - 10.3 mg/dL Final     Calculated P Axis   Date Value Ref Range Status   04/27/2018 91 degrees Final     Calculated R Axis   Date Value Ref Range Status   04/27/2018 97 degrees Final     Calculated T Axis   Date Value Ref Range Status   04/27/2018 78 degrees Final     Airway:* No  LDAs found * spontaneous, room air, unassisted  Blood pressure 138/75, pulse (!) 102, temperature 37.1 C (98.8 F), resp. rate 14, SpO2 92 %.

## 2018-06-22 NOTE — Discharge Instructions (Signed)
[facility]  [place], [place] [address]     Anesthesia Discharge Instructions    1.  Do NOT drive an automobile today.    2.  Do NOT sign any legal documents for 24 hours.    3.  Do NOT operate any electrical equipment today.    Fall risk prevention education has been reviewed with the patient/significant other.

## 2018-06-22 NOTE — H&P (Signed)
[place], [place] [address]       Short stay H&P    [name], 57 y.o. female, Dema Severin  Date of Birth:  11/04/1960  Encounter Start Date:  06/22/2018  Inpatient Admission Date:   PCP: Alexa Pierce, PA    HPI   [name] is a 57 y.o. female who presents with   1. GERD (gastroesophageal reflux disease)    2. Hiatal hernia    3. Dysphagia    4. Esophageal stricture        PAST MEDICAL/ FAMILY/ SOCIAL HISTORY:    Past Medical History:   Diagnosis Date   . Anesthesia complication     O2 level dropped   . Arthritis    . Chronic obstructive pulmonary disease    . Cigarette smoker    . Depression    . Esophageal reflux     sleeps on side   . Hyperkalemia     will repeat labs 05/01/18   . Hyperlipidemia    . Neurofibromatosis (CMS [place])    . 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 Problems with swallowing    . Shortness of breath     with moderate exertion         Past Surgical History:   Procedure Laterality Date   . HX CHOLECYSTECTOMY  2011   . HX EYE SURGERY      Eye lid reduction   . HX HYSTERECTOMY  07/2011    FULL HYSTERECTOMY   . 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 PB COLONOSCOPY,DIAGNOSTIC N/A 04/18/2013    COLONOSCOPY performed by Levie Heritage, MD at [facility] ([place])   . SKIN SURGERY  2015    Excisional biopsy of neurofibromatosis skin lesions of the right shoulder, right back and right upper quadrant abdomen         Family History:     Family Medical History:     Problem Relation (Age of Onset)    Alcohol abuse Mother, Sister    Breast Cancer Maternal Grandmother    Cancer Father, Maternal Aunt, Maternal Uncle, Maternal Grandmother, Maternal Uncle, Maternal Uncle    Coronary Artery Disease Father    Diabetes Sister    Heart Attack Father    Hypertension (High Blood Pressure) Mother    Other Sister    Thyroid Disease Sister        Social History     Tobacco Use   . Smoking status: Current Every Day Smoker     Packs/day: 1.00     Years: 35.00     Pack years:  35.00     Types: Cigarettes     Last attempt to quit: 03/19/2016     Years since quitting: 2.2   . Smokeless tobacco: Never Used   Substance Use Topics   . Alcohol use: No     Alcohol/week: 0.0 standard drinks     Allergies   Allergen Reactions   . Codeine Hives/ Urticaria     Medications Prior to Admission     Prescriptions    ALPRAZolam (XANAX) 0.25 mg Oral Tablet    Take 1 Tab (0.25 mg total) by mouth Three times a day as needed for Insomnia or Anxiety    atorvastatin (LIPITOR) 10 mg Oral Tablet    TAKE 1 TABLET(10 MG) BY MOUTH EVERY EVENING    cyanocobalamin (VITAMIN B 12) 1,000 mcg Oral Tablet  Take 1,000 mcg by mouth Once a day    cyclobenzaprine (FLEXERIL) 5 mg Oral Tablet    Take 1 Tab (5 mg total) by mouth Three times a day as needed for Muscle spasms    ergocalciferol, vitamin D2, (DRISDOL) 50,000 unit Oral Capsule    TAKE 1 CAPSULE BY MOUTH ONCE WEEKLY    Patient not taking:  Reported on 06/22/2018    famotidine (PEPCID) 40 mg Oral Tablet    Take 1 Tab (40 mg total) by mouth Every evening    Garlic Oral Capsule    Take by mouth Once a day    ipratropium-albuterol 0.5 mg-3 mg(2.5 mg base)/3 mL Solution for Nebulization    3 mL by Nebulization route Four times a day    meloxicam (MOBIC) 15 mg Oral Tablet    TAKE 1 TABLET(15 MG) BY MOUTH EVERY DAY    MULTIVITS-MIN/IRON/FA/LUTEIN (CENTRUM SILVER WOMEN ORAL)    Take 1 Tab by mouth Once a day    OMEGA-3S/DHA/EPA/FISH OIL (OMEGA 3 ORAL)    Take by mouth Once a day    pantoprazole (PROTONIX) 40 mg Oral Tablet, Delayed Release (E.C.)    Take 1 Tab (40 mg total) by mouth Once a day Before breakfast    PARoxetine (PAXIL) 40 mg Oral Tablet    TAKE 1 TABLET(40 MG) BY MOUTH EVERY DAY    PROAIR HFA 90 mcg/actuation Inhalation HFA Aerosol Inhaler    INHALE 1 TO 2 PUFF BY MOUTH EVERY 6 HOURS AS NEEDED    scopolamine (TRANSDERM SCOP) 1 mg over 3 days Transdermal patch 3 day    1 Patch by Transdermal route Every 72 hours           ROS/Physical exam    Vitals  Filed Vitals:     06/22/18 1100   BP: 109/75   Pulse: 79   Resp: 16   Temp: 36.2 C (97.2 F)   SpO2: 100%       Physical exam:  General:  Normal  HEENT:  Normal  Cardiovascular:  Normal  Lungs:  Normal  Breasts:  defered  Abdomen:  Normal  Genitalia:  defered  Extremities:  Normal  Skin:  Normal  Neurologic:  Normal    Labs  I have reviewed all lab results.    IMPRESSION & PLAN:   1. GERD (gastroesophageal reflux disease)    2. Hiatal hernia    3. Dysphagia    4. Esophageal stricture        PLan:EGD possible dilatation  Eudelia Bunch, MD

## 2018-06-22 NOTE — OR Nursing (Signed)
Dr. Lovett Calender dilating patient's esophagus with size 11 and size 19f savory dilator

## 2018-06-23 LAB — HISTORICAL SURGICAL PATHOLOGY SPECIMEN

## 2018-06-23 NOTE — Anesthesia Postprocedure Evaluation (Signed)
Anesthesia Post Op Evaluation    Patient: [name]  Procedure(s):  GASTROSCOPY WITH BIOPSY  GASTROSCOPY WITH DILATION    Last Vitals:Temperature: 36.7 C (98.1 F) (06/22/18 1500)  Heart Rate: 98 (06/22/18 1528)  BP (Non-Invasive): (!) 122/50 (06/22/18 1528)  Respiratory Rate: 18 (06/22/18 1528)  SpO2: 98 % (06/22/18 1528)    Patient is sufficiently recovered from the effects of anesthesia to participate in the evaluation and has returned to their pre-procedure level.       Level of consciousness: awake and alert  Pain management: adequate  Airway patency: patent  Anesthetic complications: no  Cardiovascular status: acceptable  Respiratory status: acceptable  Hydration status: acceptable  Patient post-procedure temperature: Pt Normothermic   PONV Status: Absent  Comments: RN reports no anesthesia complications.

## 2018-06-26 ENCOUNTER — Encounter (INDEPENDENT_AMBULATORY_CARE_PROVIDER_SITE_OTHER): Payer: Self-pay

## 2018-07-18 ENCOUNTER — Ambulatory Visit (INDEPENDENT_AMBULATORY_CARE_PROVIDER_SITE_OTHER): Payer: Self-pay

## 2018-07-20 ENCOUNTER — Other Ambulatory Visit (INDEPENDENT_AMBULATORY_CARE_PROVIDER_SITE_OTHER): Payer: Self-pay

## 2018-07-20 DIAGNOSIS — M1990 Unspecified osteoarthritis, unspecified site: Secondary | ICD-10-CM

## 2019-05-20 ENCOUNTER — Other Ambulatory Visit: Payer: Self-pay

## 2019-06-13 IMAGING — CR CHEST 2 VWS PA LAT
1 series · 2 of 2 positions shown · non-contrast
Comparison: None

HISTORY/INDICATION:  Dysphagia with left lung pain
TECHNIQUE: Chest, 2 views

[Series 1: pa · 0.17mm/px · 2 of 2 slices shown]
[im 1/2]
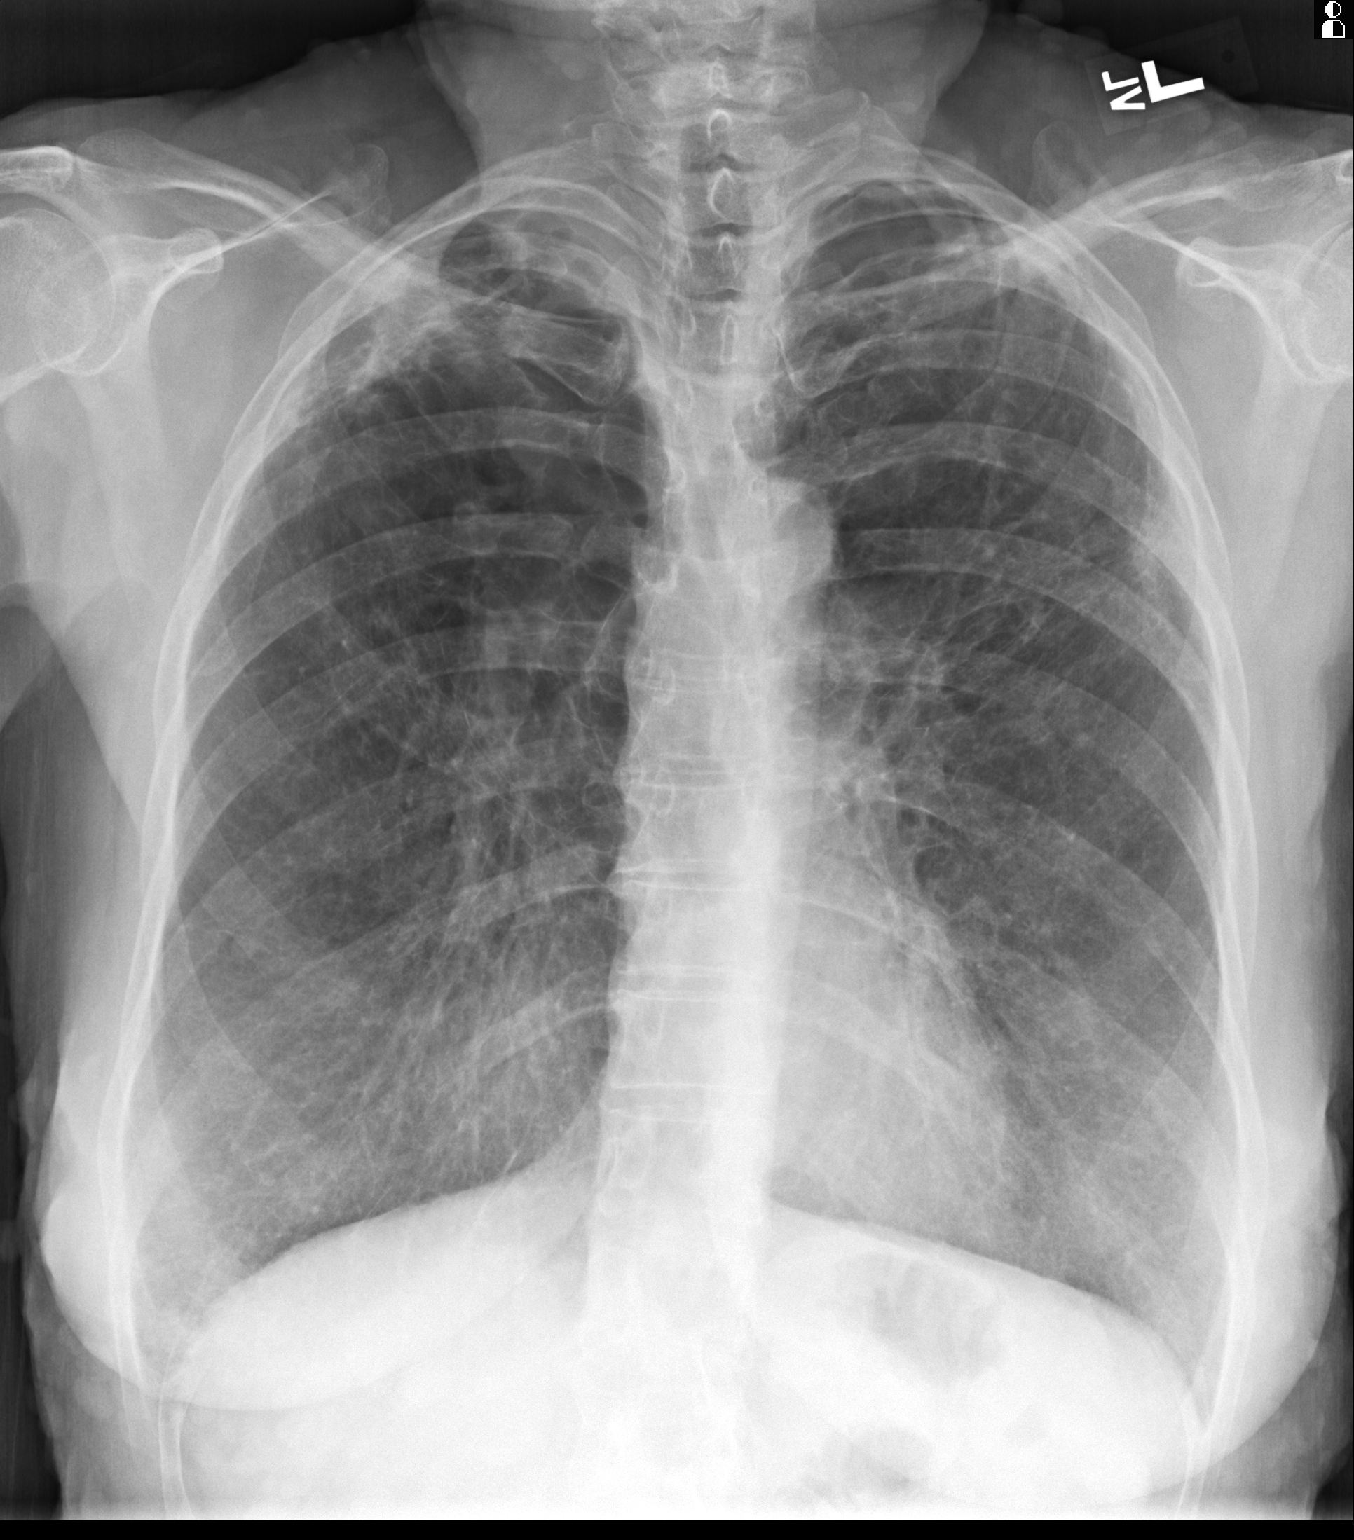
[im 2/2]
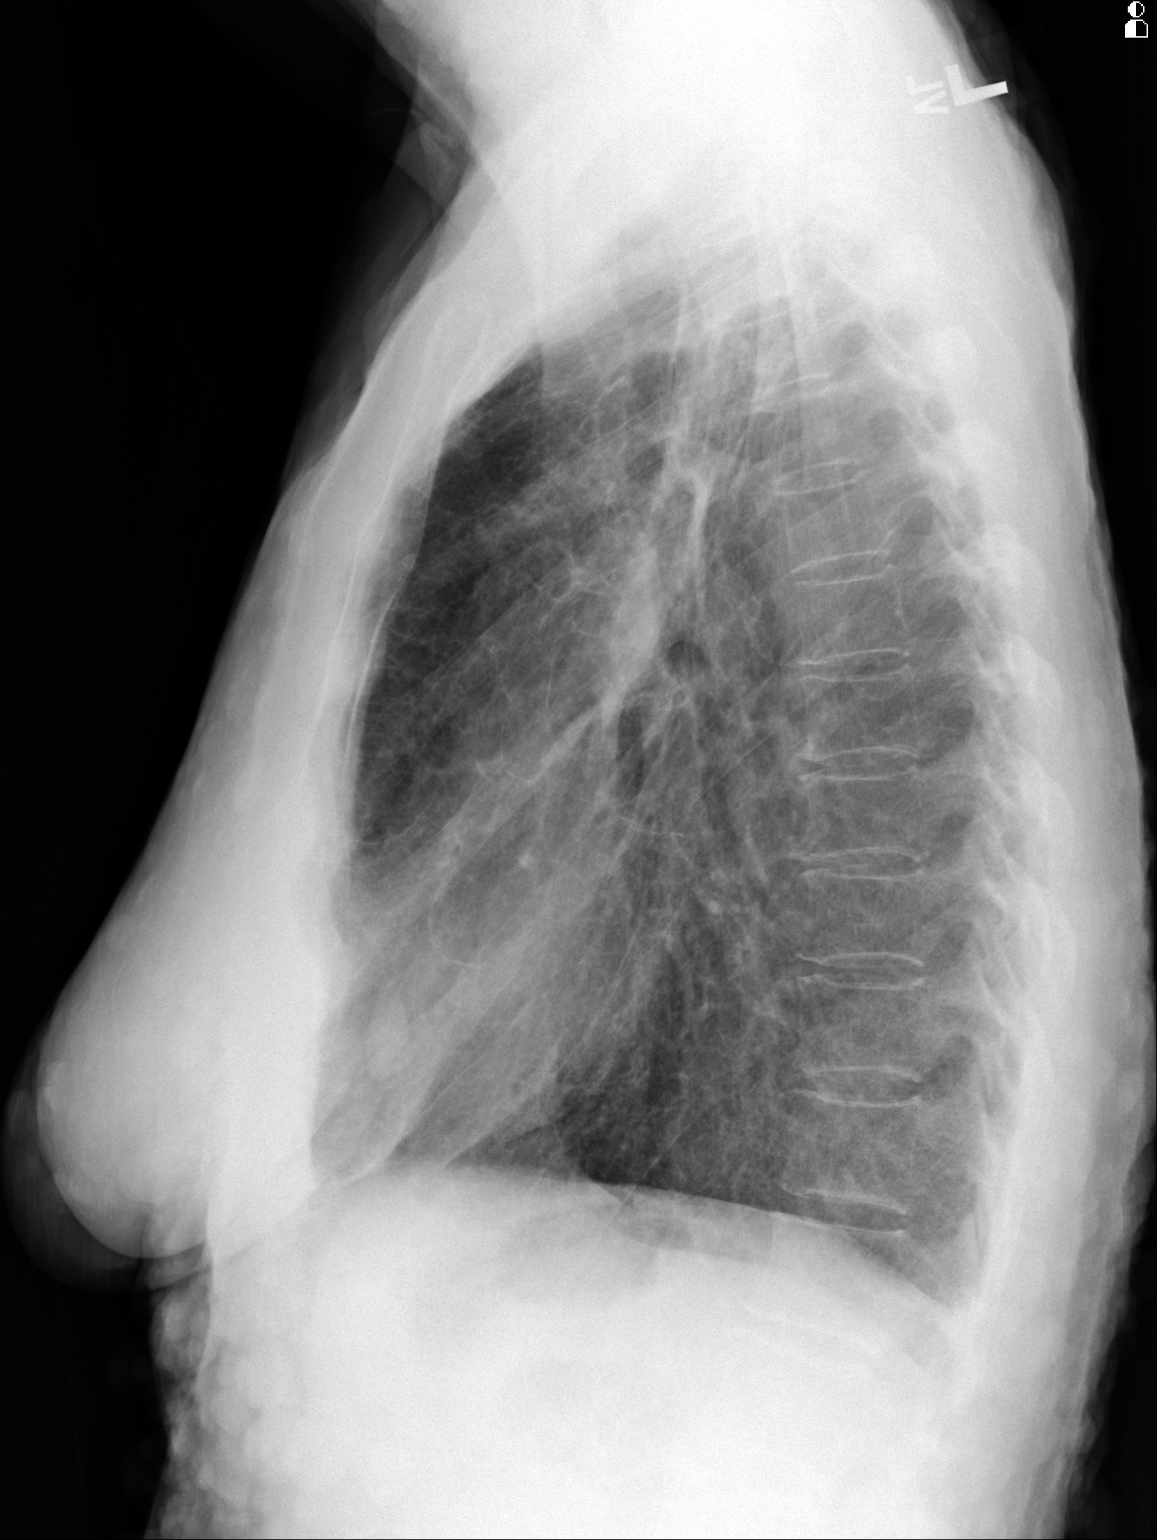

[2 of 2 positions shown; findings below may reference images not displayed]

FINDINGS: Biapical pulmonary scarring more pronounced on the right is seen with loss of volume of the upper lobes with COPD and basal bronchitis. No pulmonary nodule or mass is seen but the patient is noted to have numerous cutaneous nodules consistent with clinical diagnosis of neurofibromatosis. No bone findings are present.
IMPRESSION: Biapical pulmonary scarring more pronounced on the right with COPD and basal bronchitis; cutaneous nodules are present consistent with clinical diagnosis of neurofibromatosis.

## 2019-07-16 IMAGING — MG MAMMO SCRN BIL W/CAD TOMO
8 series · 9 of 24 positions shown · non-contrast
Comparison: The present examination has been compared to prior imaging studies.

Images Obtained from [HOSPITAL] Imaging
INDICATION: Screening.
TECHNIQUE: Bilateral 2-D digital screening mammogram was performed followed by 3-D tomosynthesis.  Current study was also evaluated with a computer aided detection (CAD) system.
MAMMOGRAM FINDINGS:
The breasts are heterogeneously dense, which may obscure small masses.
There are stable benign-type calcifications in the left breast.
No suspicious abnormality is seen in either breast.

[R MLO]
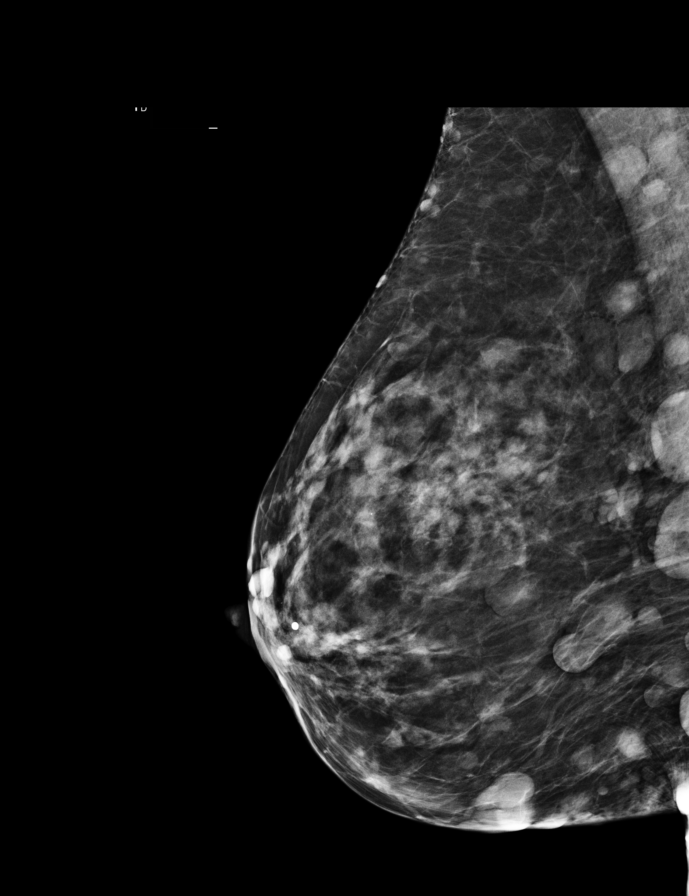

[L MLO]
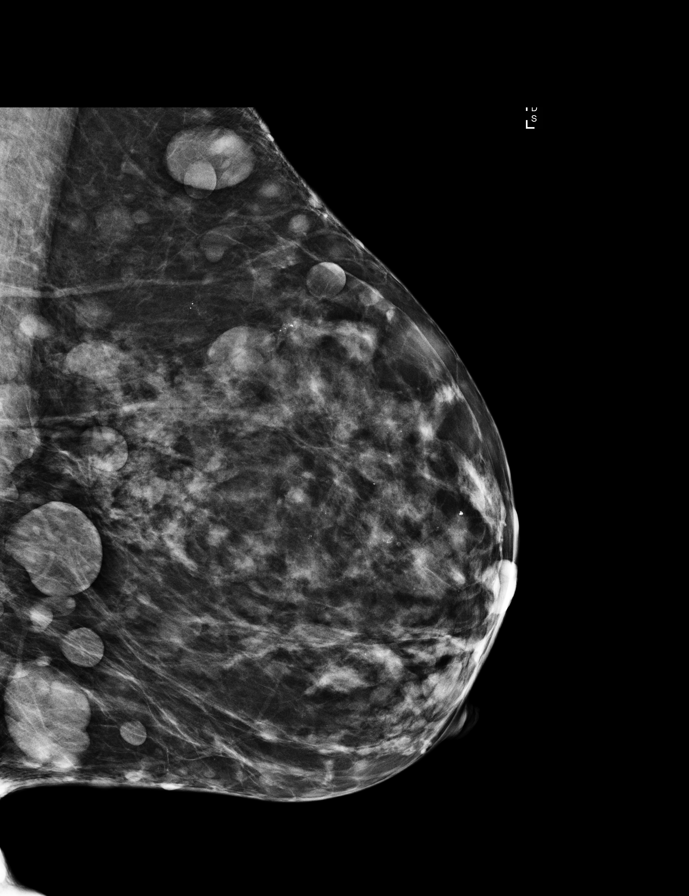

[R CC]
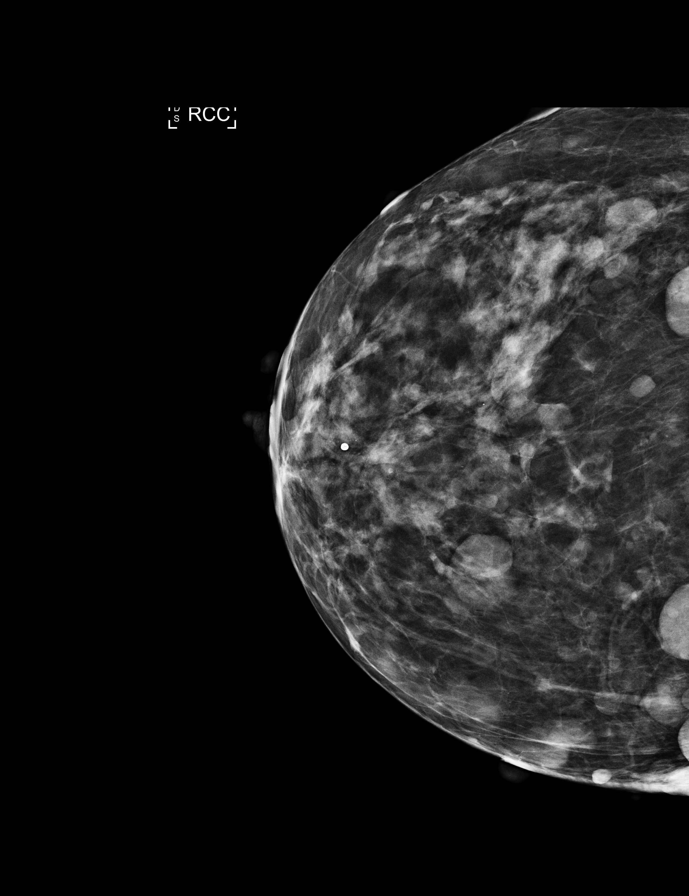

[L CC]
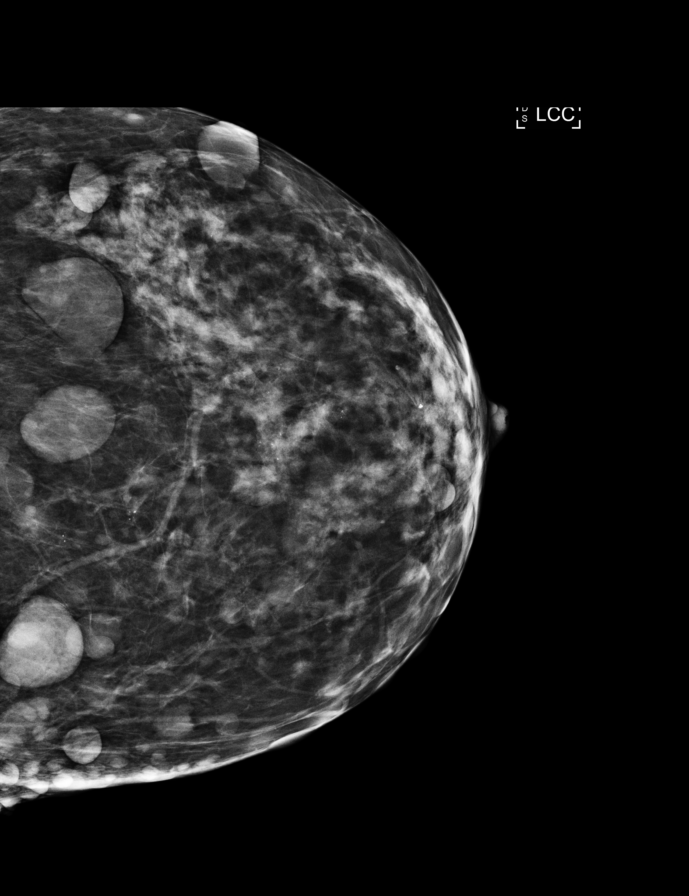

[L CC tomo · 2 of 41 frames shown]
[frame 14/41]
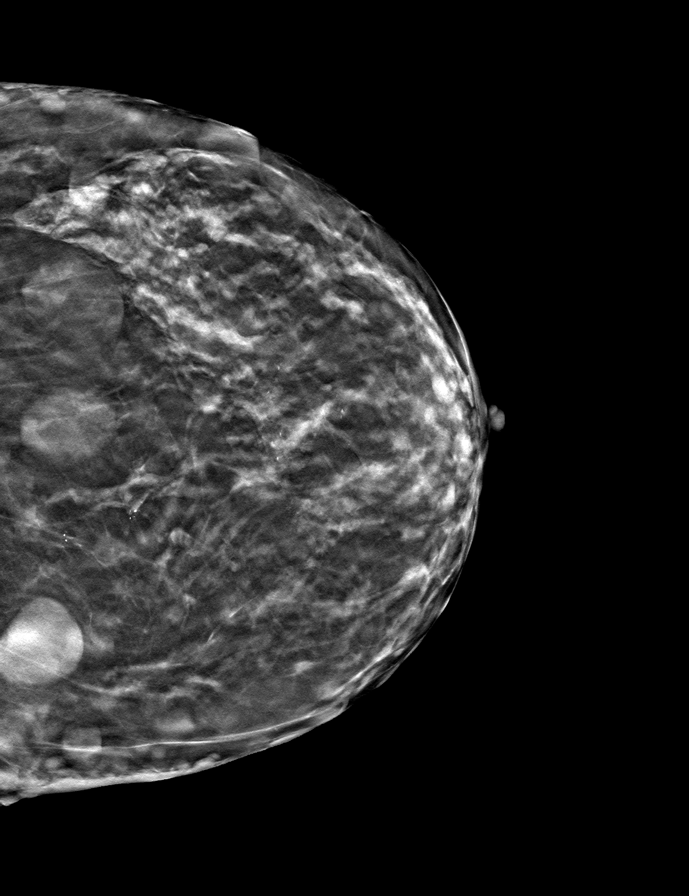
[frame 21/41]
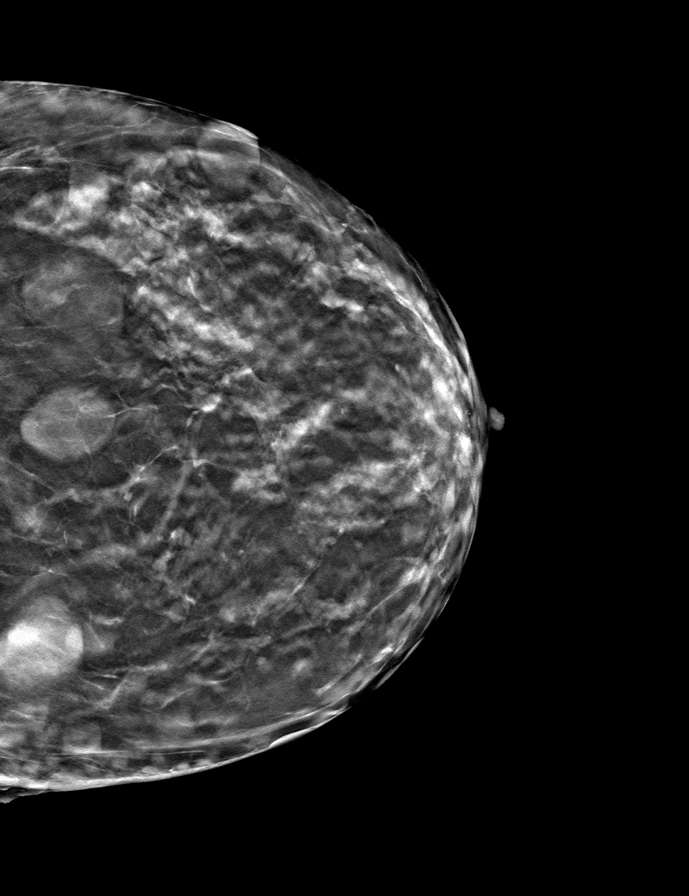

[L MLO tomo · tomo slice 21/42.0]
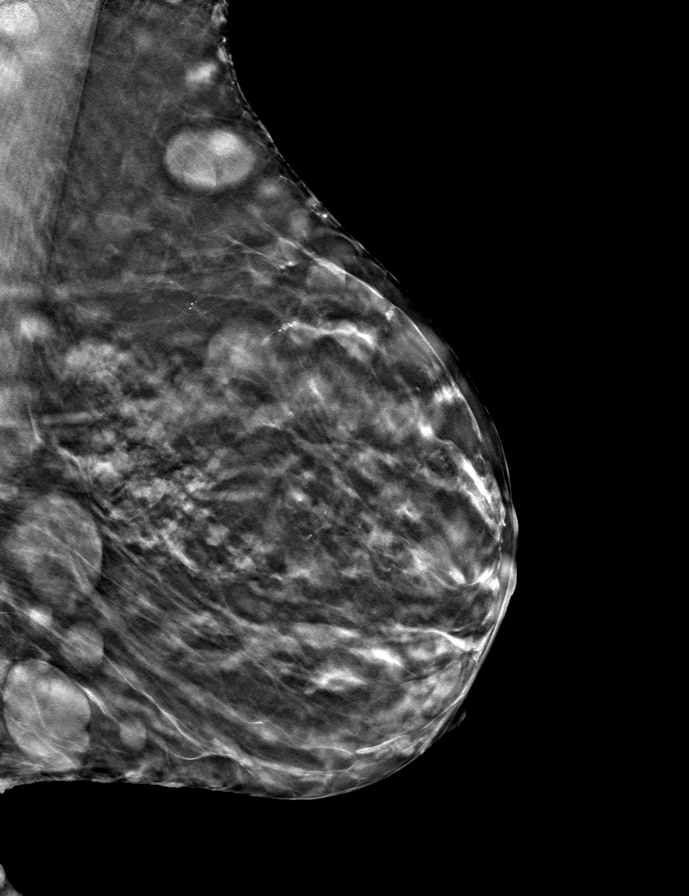

[R CC tomo · tomo slice 21/40.0]
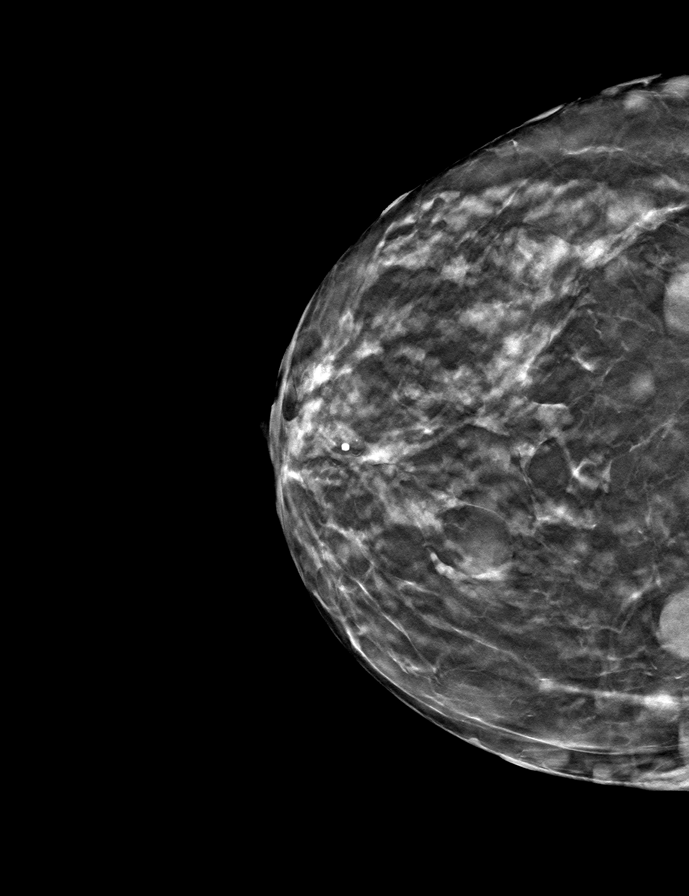

[R MLO tomo · tomo slice 21/41.0]
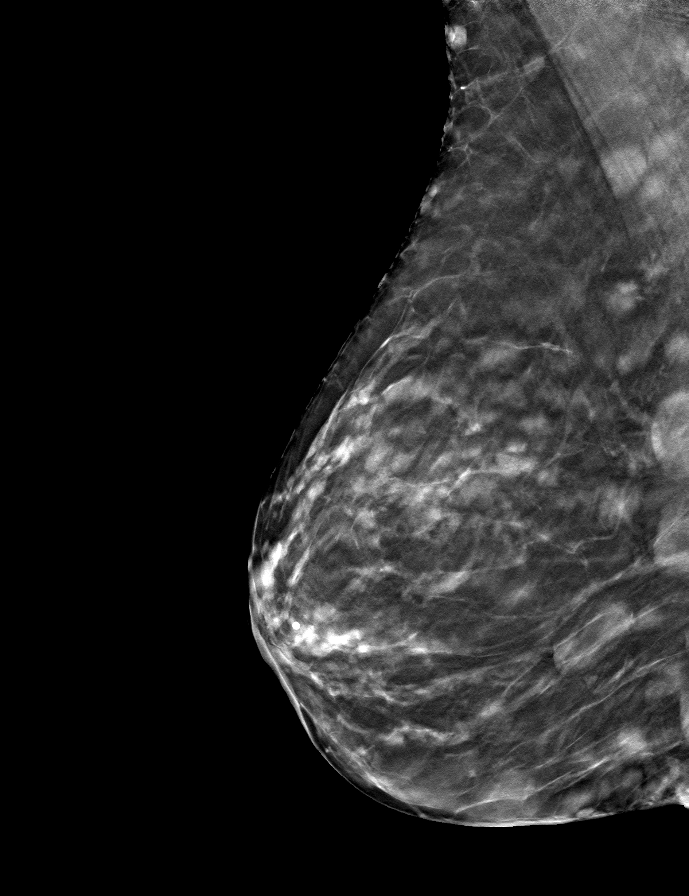

[9 of 24 positions shown; findings below may reference images not displayed]

IMPRESSION: There is no mammographic evidence of malignancy.
Screening mammogram recommended in 1 year.
BI-RADS Category 2: Benign

## 2020-02-01 ENCOUNTER — Encounter (INDEPENDENT_AMBULATORY_CARE_PROVIDER_SITE_OTHER): Payer: Self-pay

## 2020-03-09 ENCOUNTER — Other Ambulatory Visit: Payer: Self-pay

## 2020-11-06 ENCOUNTER — Encounter (INDEPENDENT_AMBULATORY_CARE_PROVIDER_SITE_OTHER): Payer: Self-pay

## 2021-01-04 ENCOUNTER — Other Ambulatory Visit: Payer: Self-pay

## 2021-07-24 IMAGING — MG MAMMO SCRN BIL W/CAD TOMO
6 of 10 series · 6 of 26 positions shown · non-contrast
Comparison: The present examination has been compared to prior imaging studies.

Images Obtained from [facility]
INDICATION: Screening.
TECHNIQUE: Bilateral 2-D digital screening mammogram was performed followed by 3-D tomosynthesis.  Current study was also evaluated with a computer aided detection (CAD) system.
MAMMOGRAM FINDINGS:
The breasts are heterogeneously dense, which may obscure small masses.
There is a new area of architectural distortion in the left breast upper outer quadrant at 1 o'clock at anterior depth.
In the right breast, no suspicious masses, calcifications or other abnormalities are seen.

[L MLO (1 of 2)]
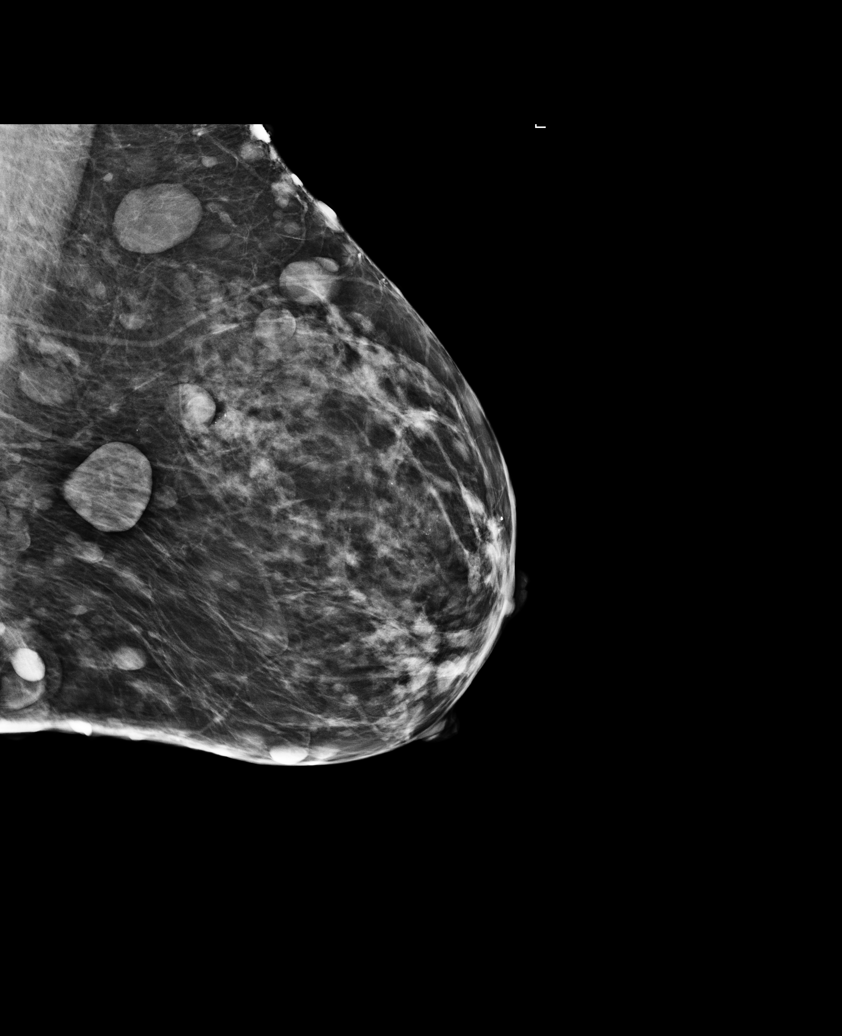

[R CC]
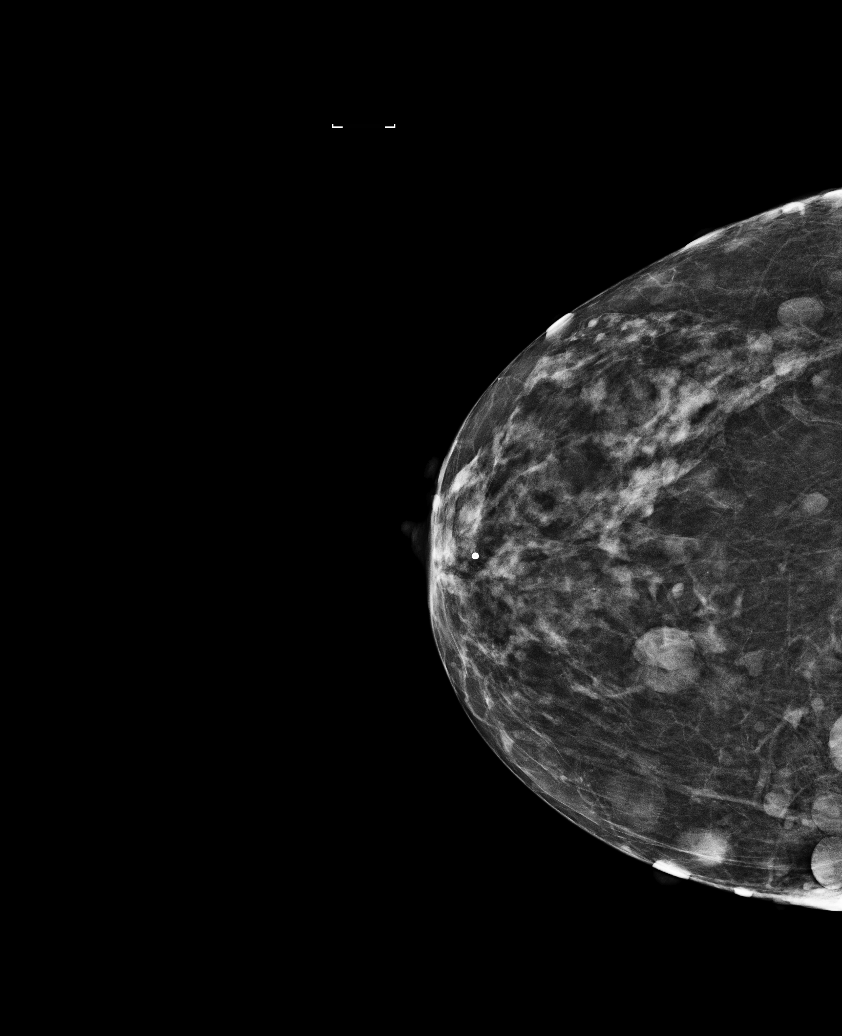

[L CC (1 of 2)]
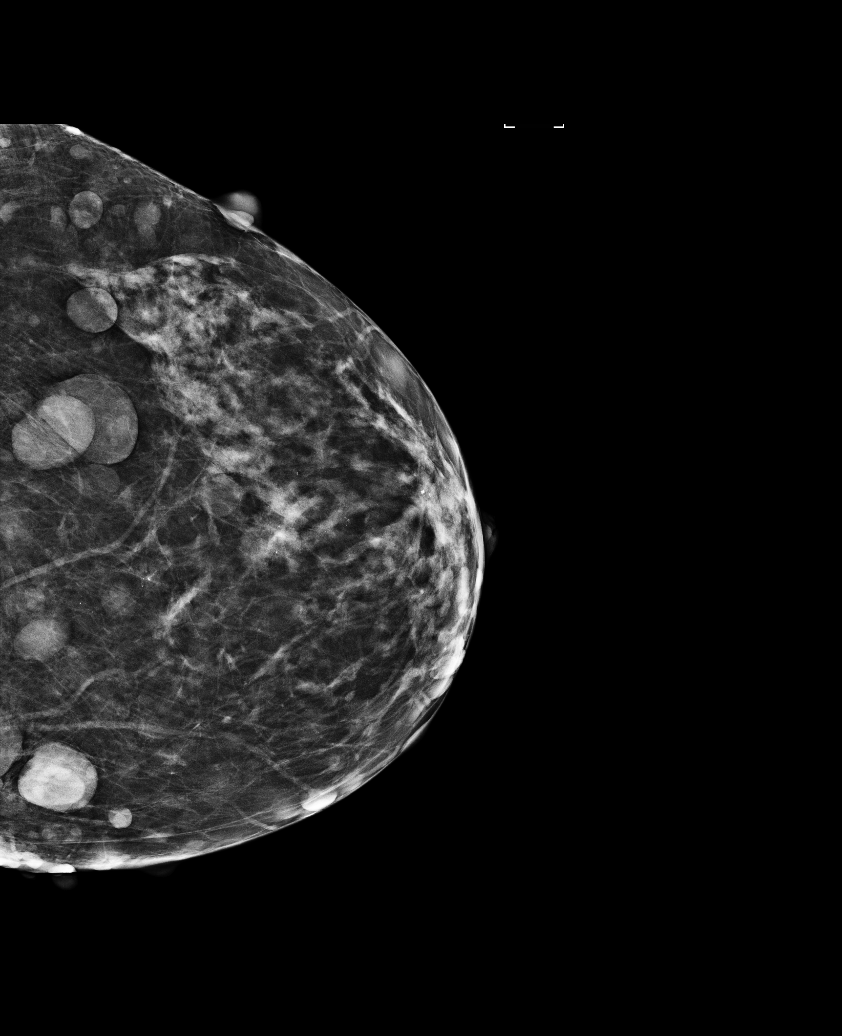

[R MLO]
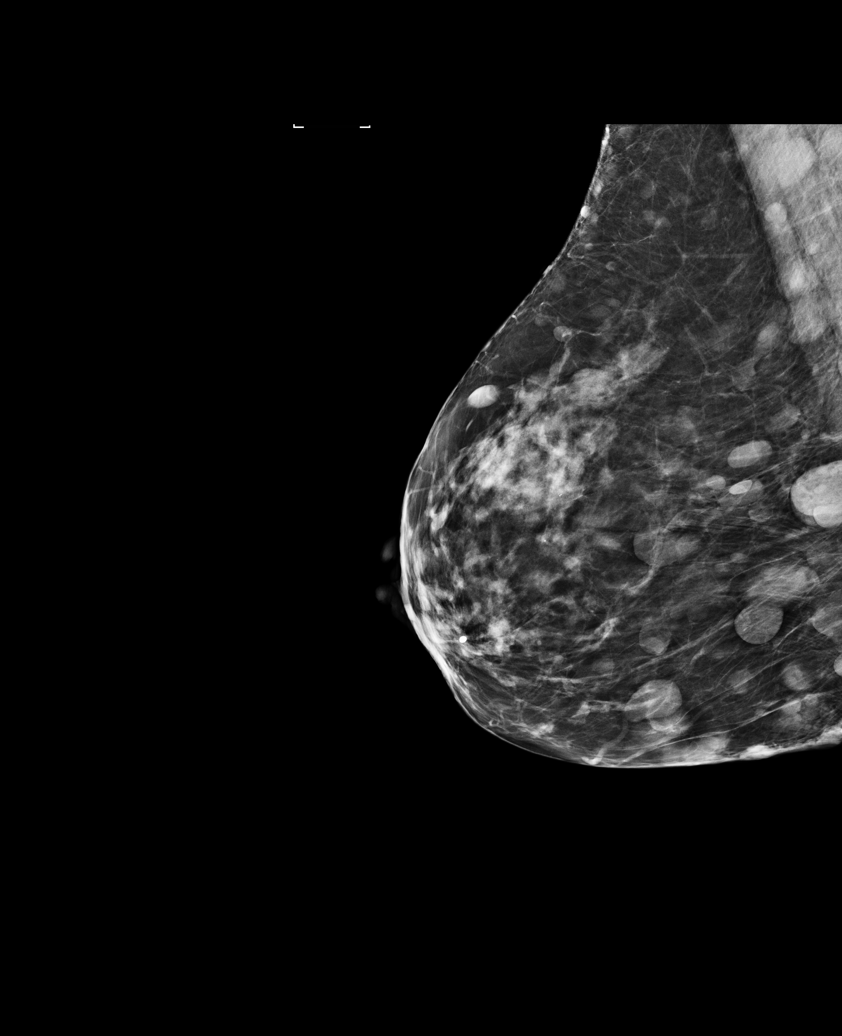

[L MLO (2 of 2)]
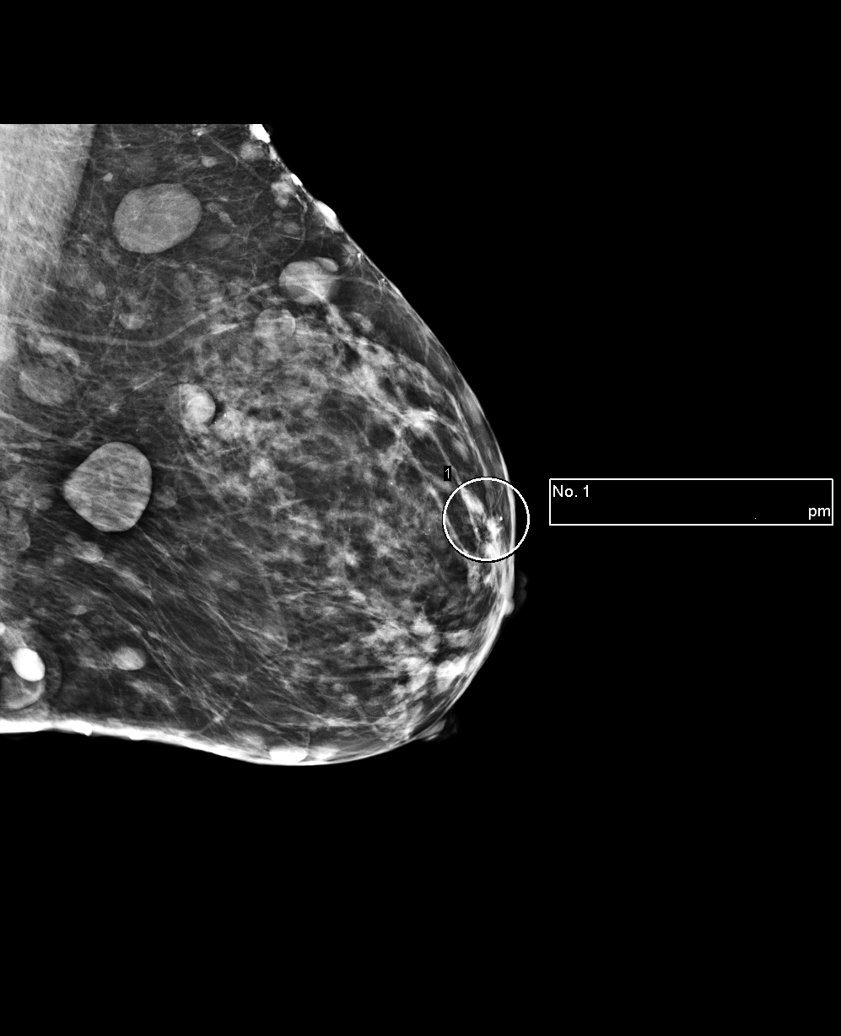

[L CC (2 of 2)]
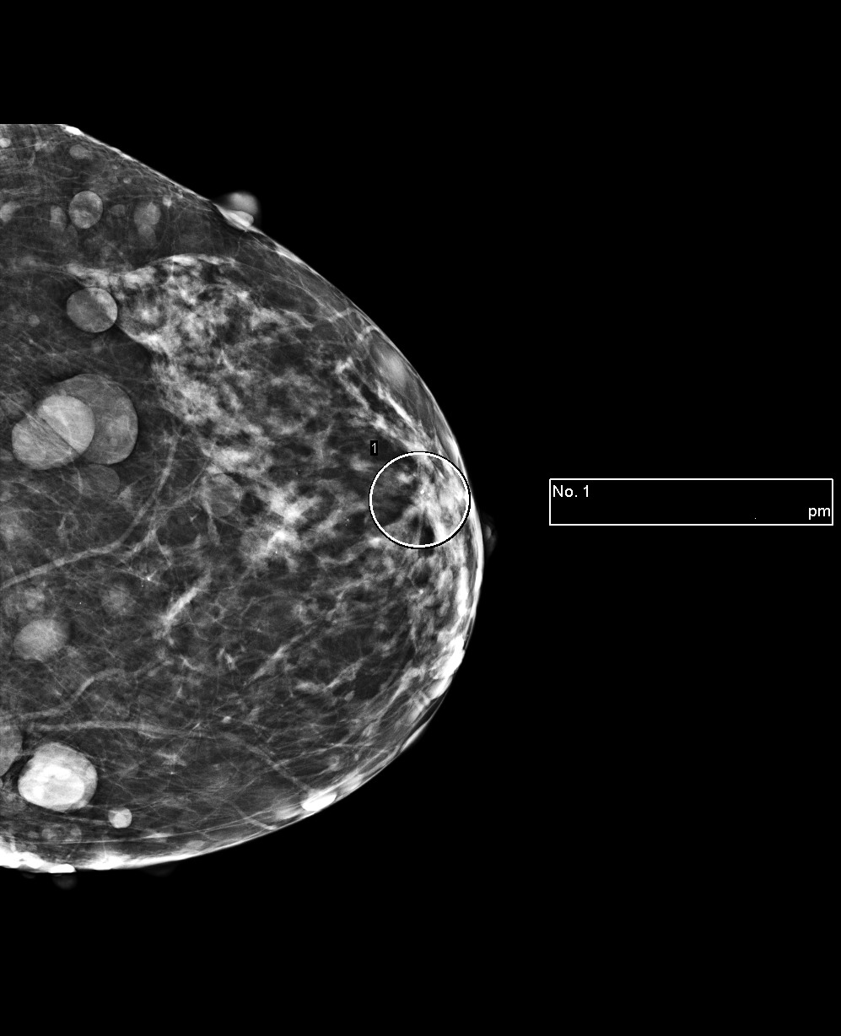

[6 of 26 positions shown; findings below may reference images not displayed]

IMPRESSION: New area of architectural distortion in the left breast requires additional evaluation. Diagnostic mammogram with possible ultrasound recommended.
BI-RADS Category 0: Incomplete: Needs Additional Imaging Evaluation

## 2021-08-13 IMAGING — MG MAMMO DIAG LT W/TOMO
4 series · 4 of 12 positions shown · non-contrast
Comparison: Multiple prior exams most recently 07/24/2021

INDICATION: Left breast distortion
TECHNIQUE: Left 2-D digital diagnostic mammogram was performed followed by 3-D tomosynthesis. Current study was also evaluated with a computer aided detection (CAD) system. Targeted left breast ultrasound was also performed.

[L CC]
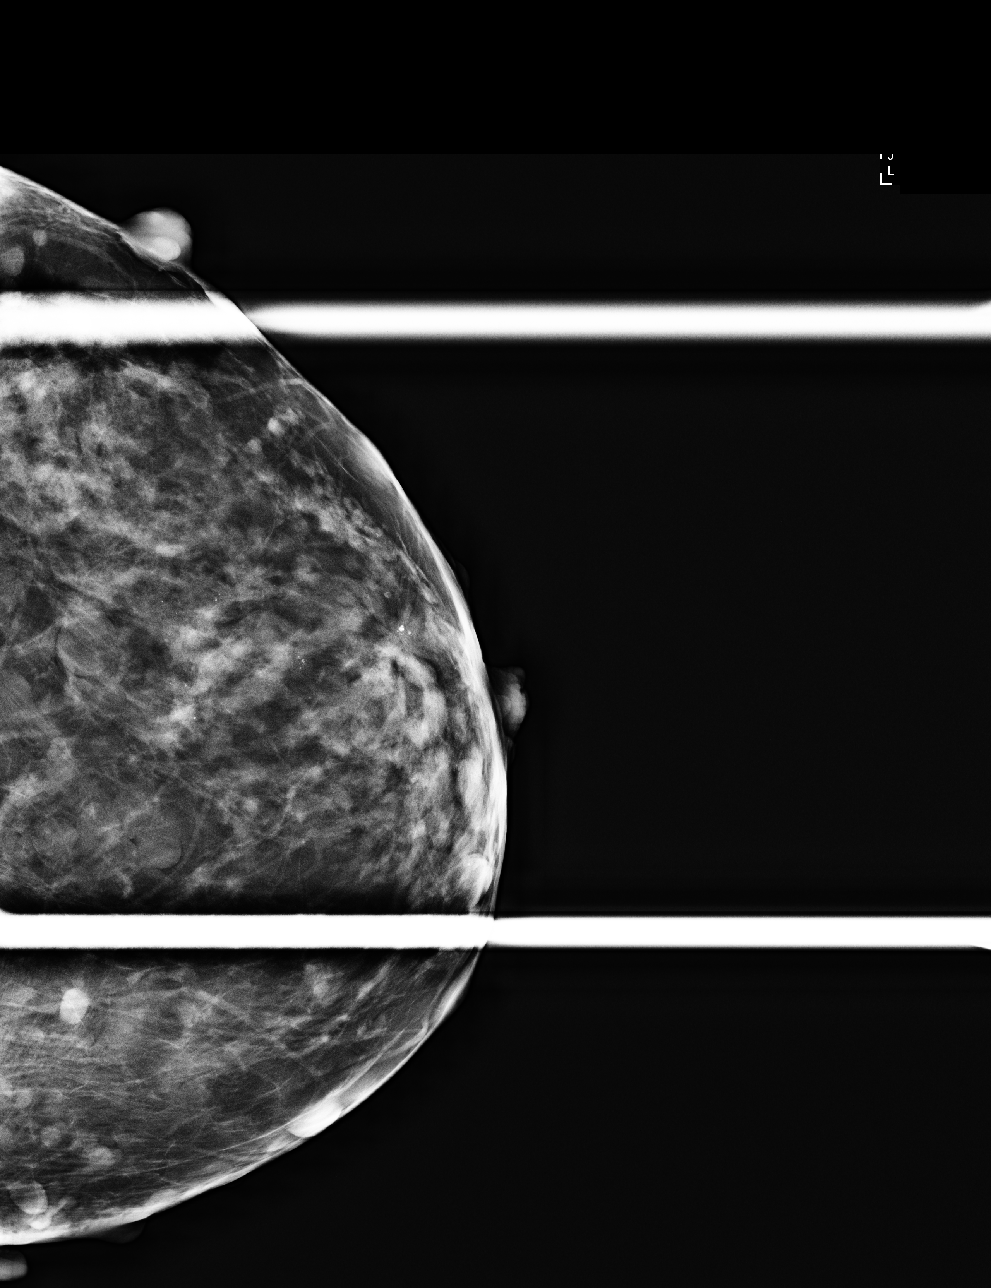

[L ML]
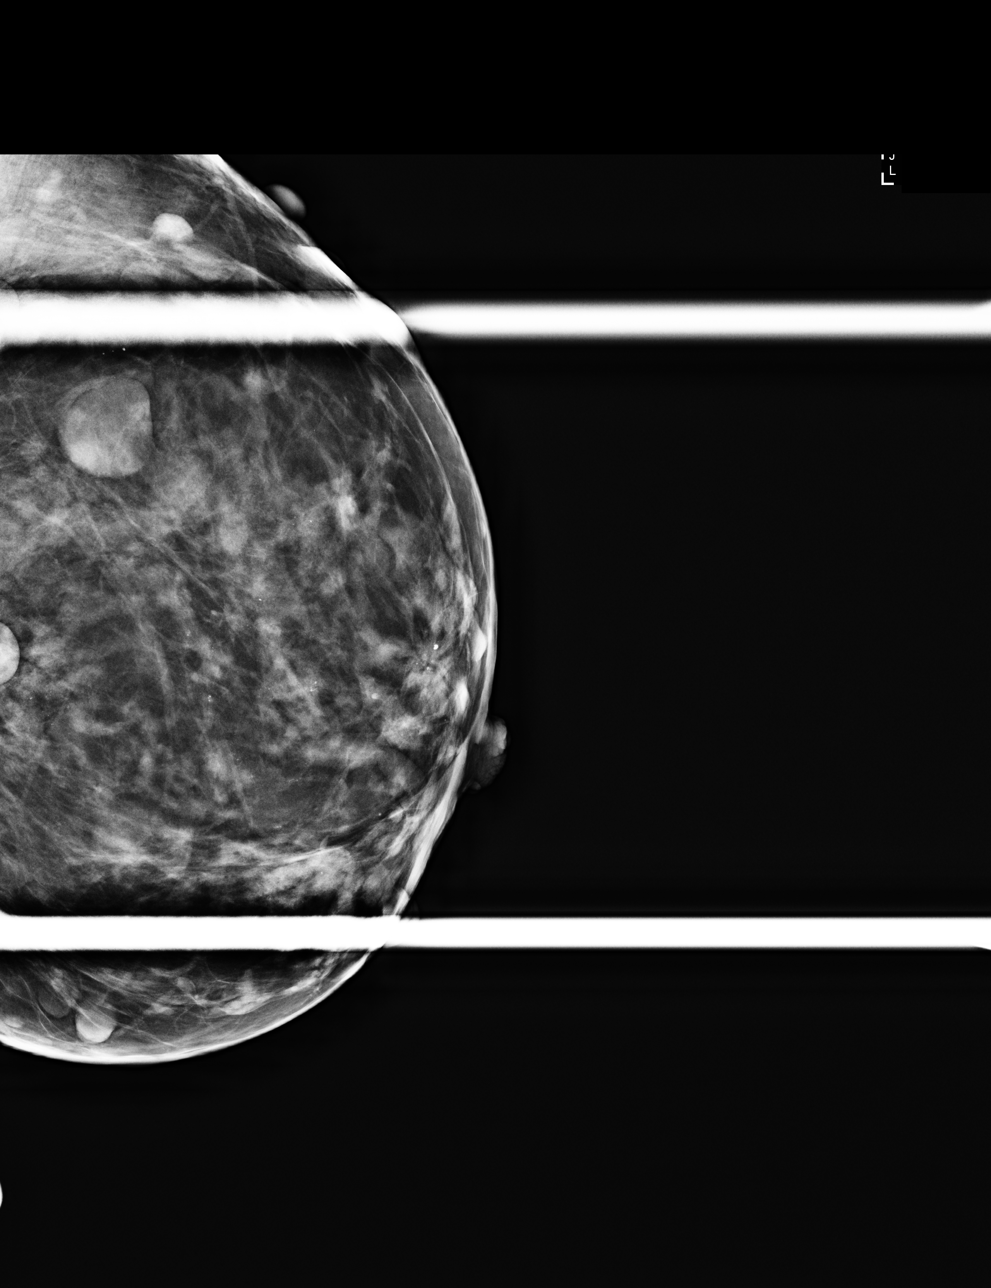

[L CC tomo · tomo slice 27/52.0]
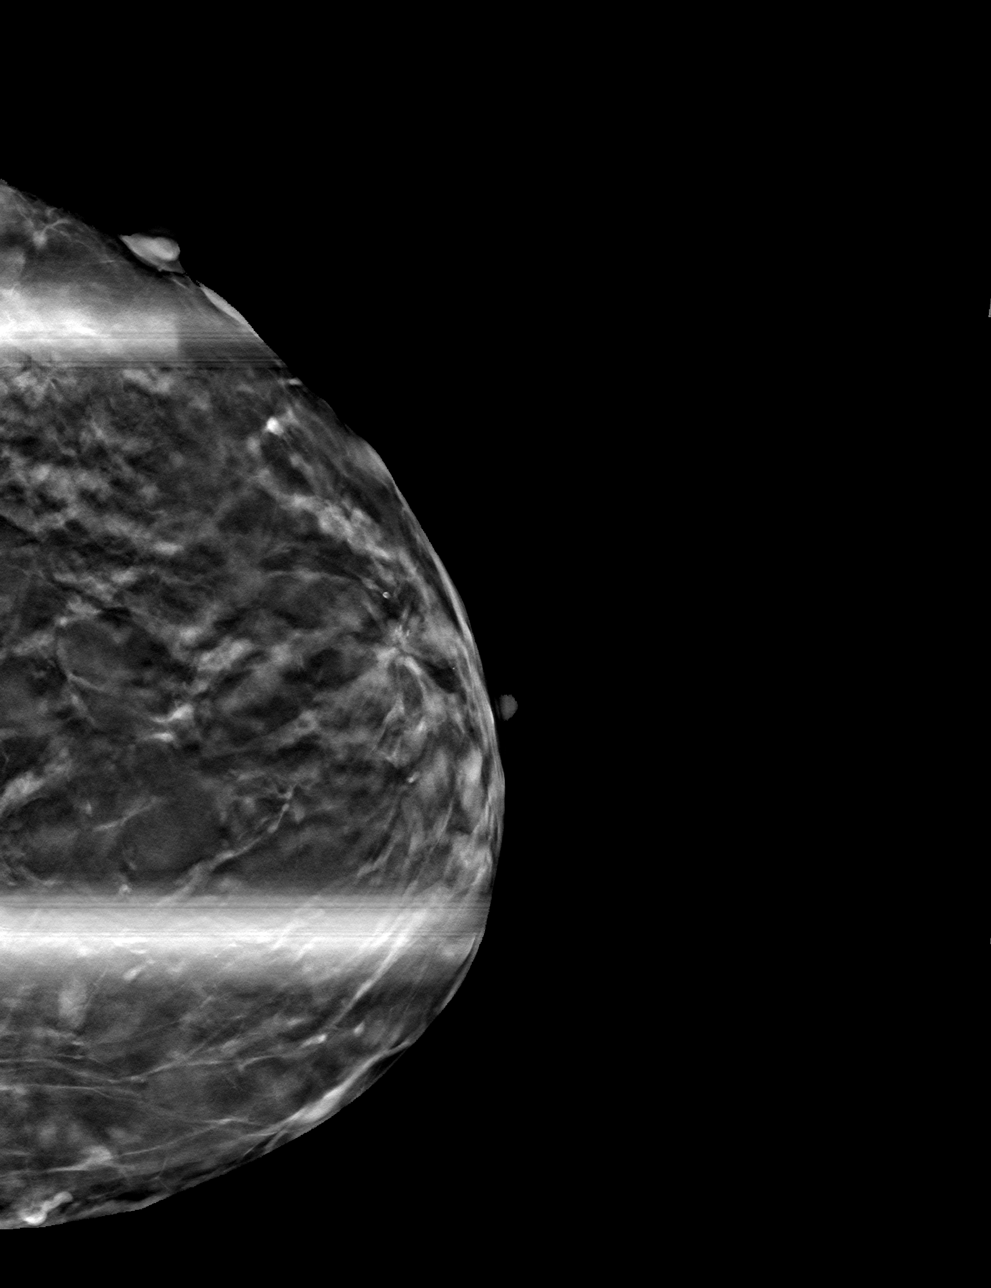

[L ML tomo · tomo slice 27/53.0]
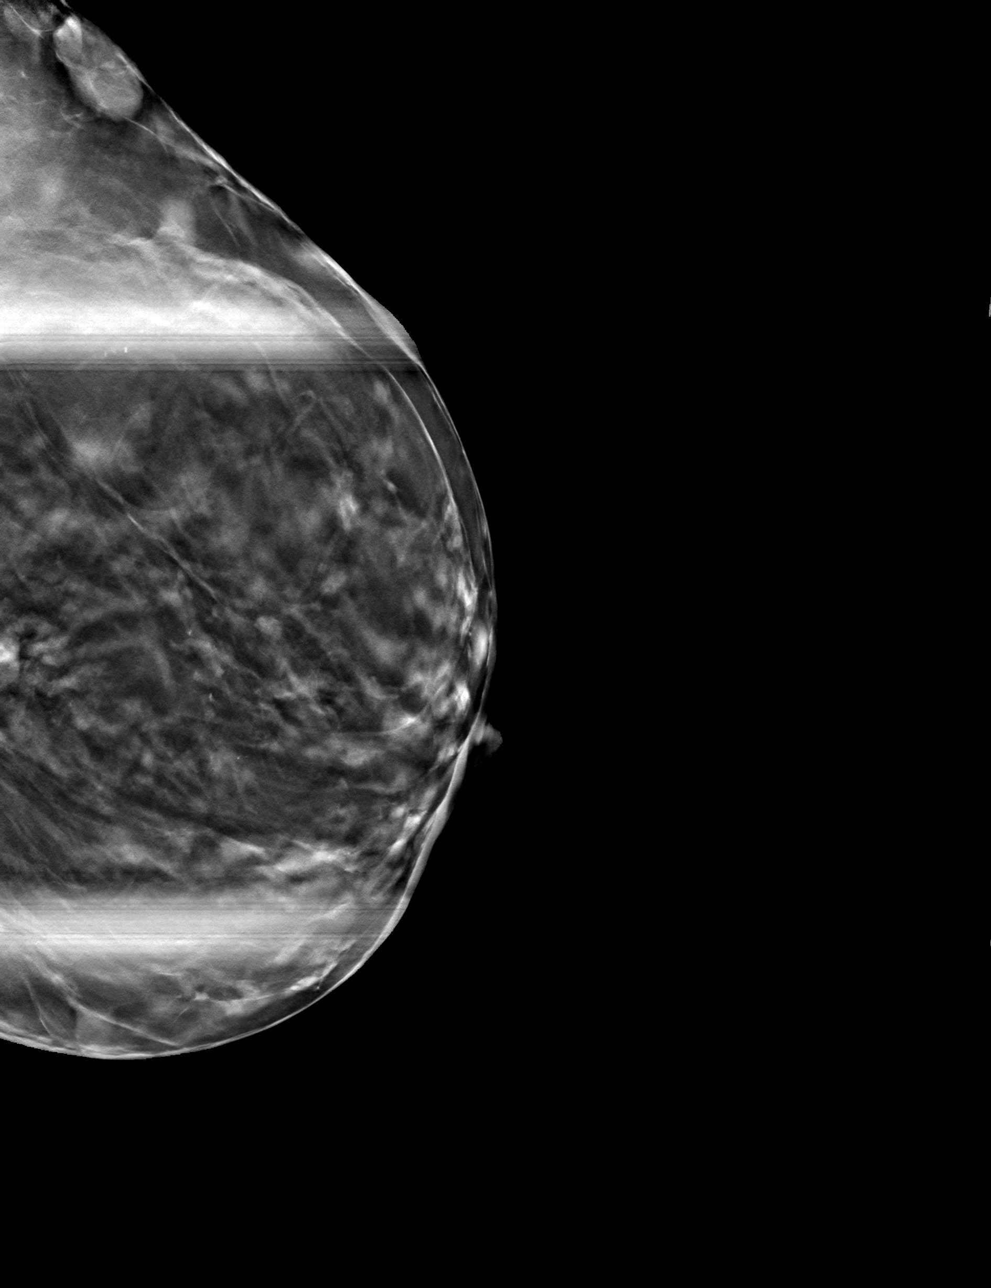

[4 of 12 positions shown; findings below may reference images not displayed]

FINDINGS: LEFT DIAGNOSTIC MAMMOGRAM:  The breast parenchyma is heterogeneously dense, which may obscure small masses. Additional views confirm a 0.8 cm architectural distortion with associated calcifications in the left breast at [DATE] retroareolar region.

TARGETED LEFT BREAST ULTRASOUND: There is a 0.7 x 0.6 x 0.5 cm irregular shadowing hypoechoic mass in the left breast [DATE] 9 cm from the nipple. This correlates with the mammographic distortion. No suspicious abnormality identified in the left axilla.
IMPRESSION: The 0.7 cm mass in the left breast [DATE] 9 cm from the nipple is suspicious. Ultrasound-guided biopsy is recommended.

Results and recommendations were discussed with the patient.

FINAL ASSESSMENT: BI-RADS: Category 4 Suspicious

## 2021-08-13 IMAGING — US US BREAST LT LTD
1 series · 14 of 20 positions shown · non-contrast
Comparison: Multiple prior exams most recently 07/24/2021

INDICATION: Left breast distortion
TECHNIQUE: Left 2-D digital diagnostic mammogram was performed followed by 3-D tomosynthesis. Current study was also evaluated with a computer aided detection (CAD) system. Targeted left breast ultrasound was also performed.

[Series 1: us breast left ltd · 14 of 20 slices shown]
[im 1/20]
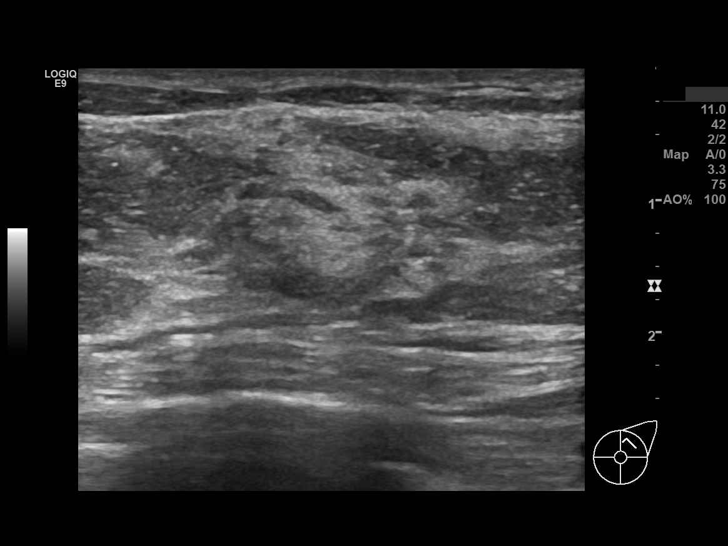
[im 3/20]
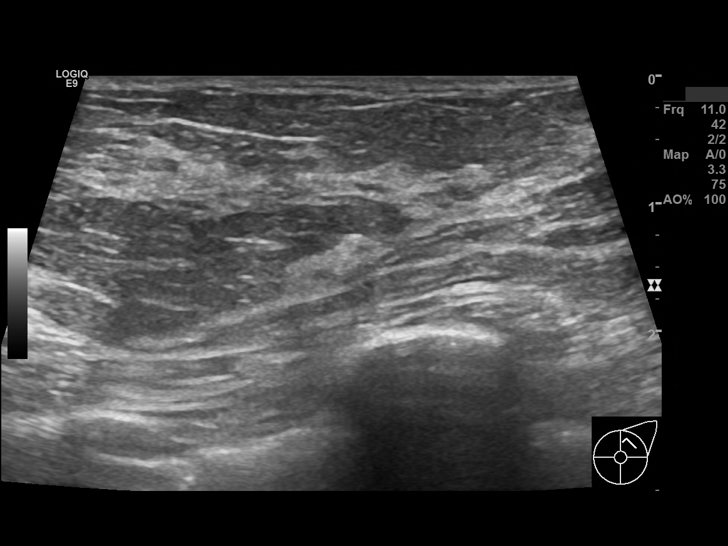
[im 4/20]
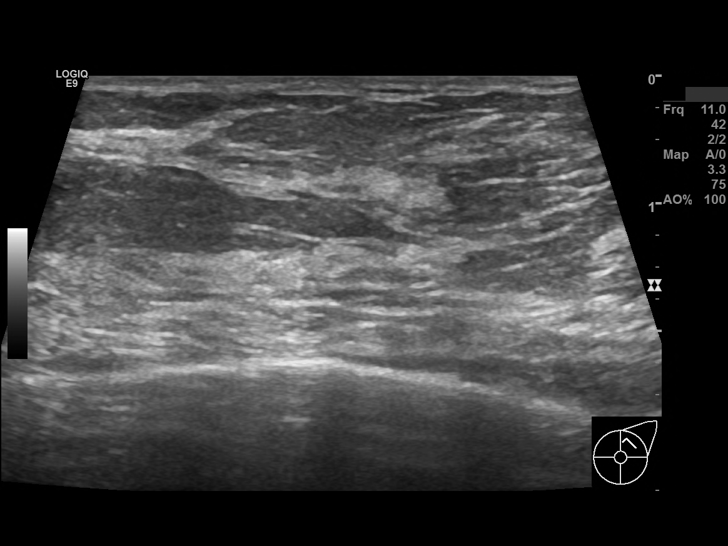
[im 6/20]
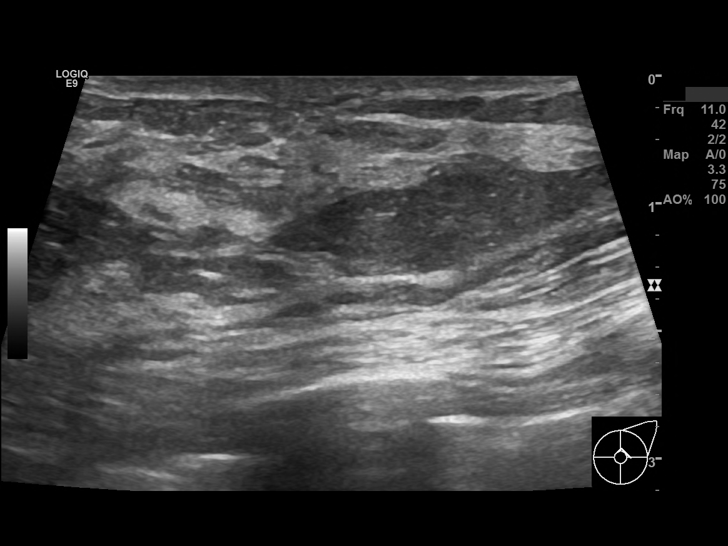
[im 7/20]
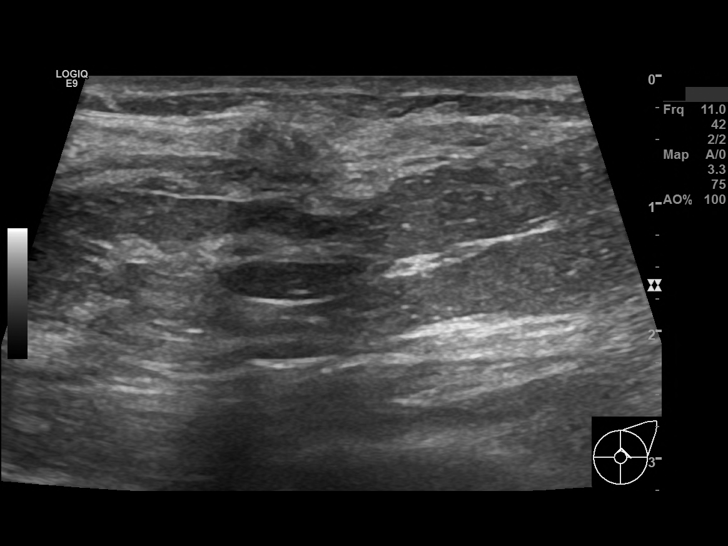
[im 8/20]
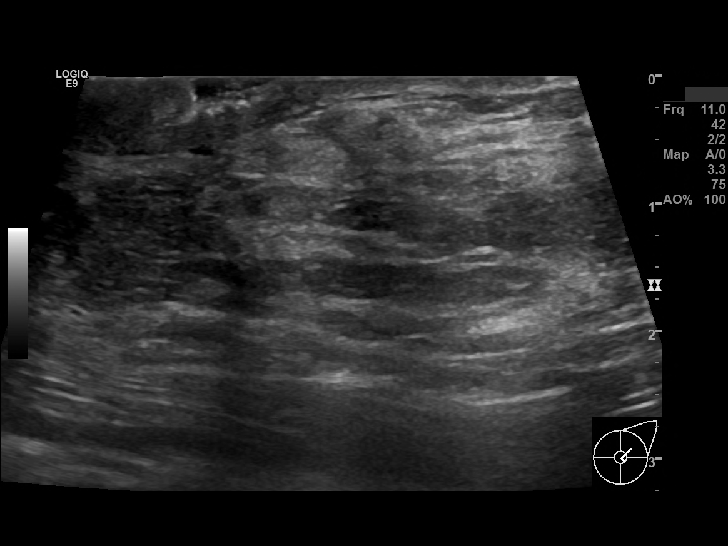
[im 10/20]
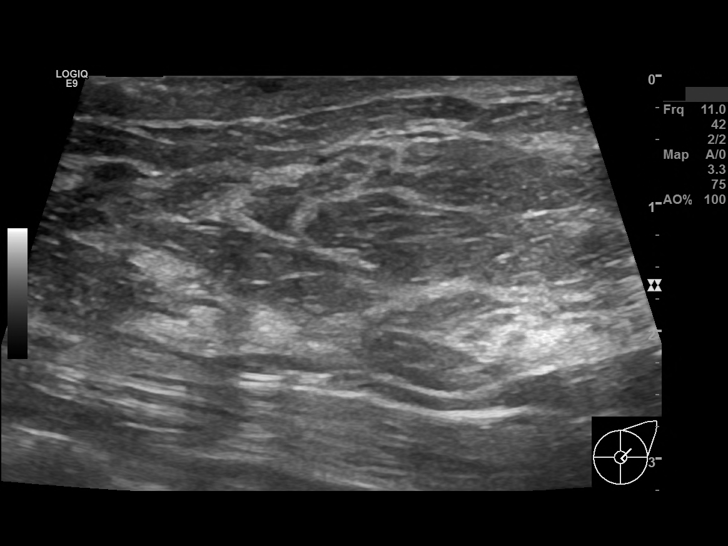
[im 11/20]
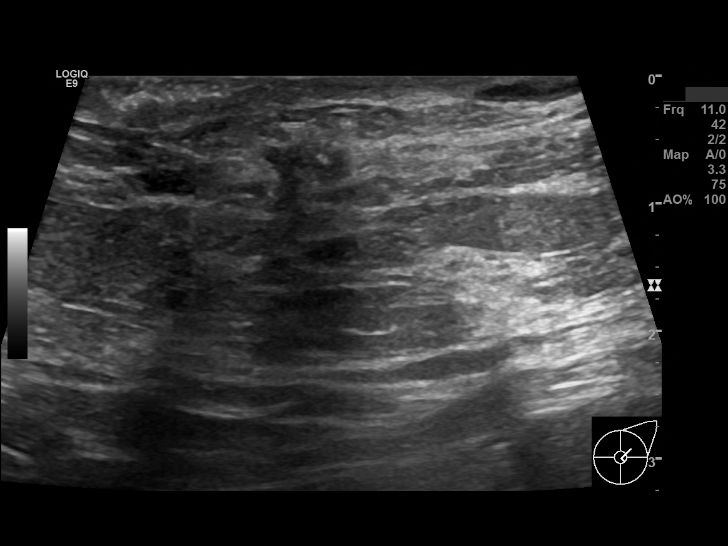
[im 13/20]
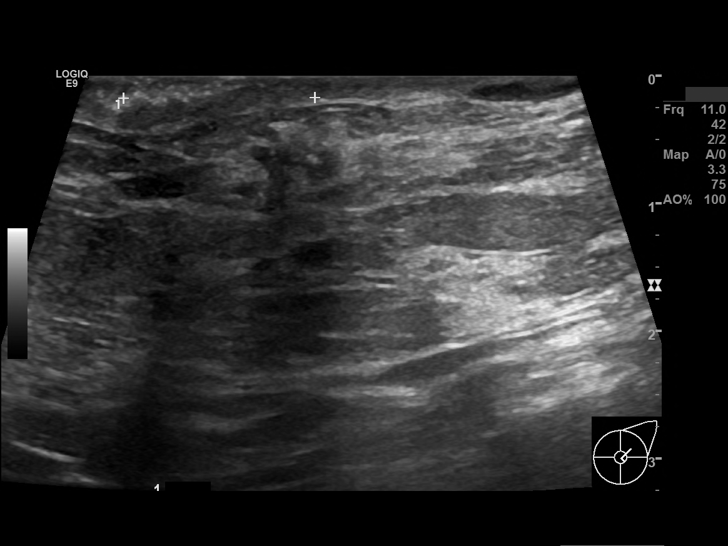
[im 14/20]
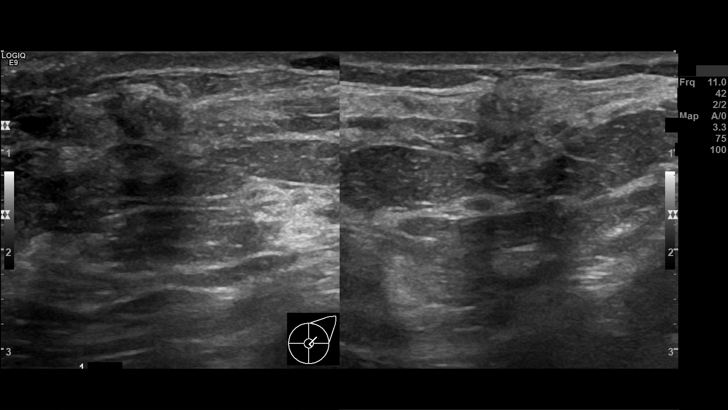
[im 16/20]
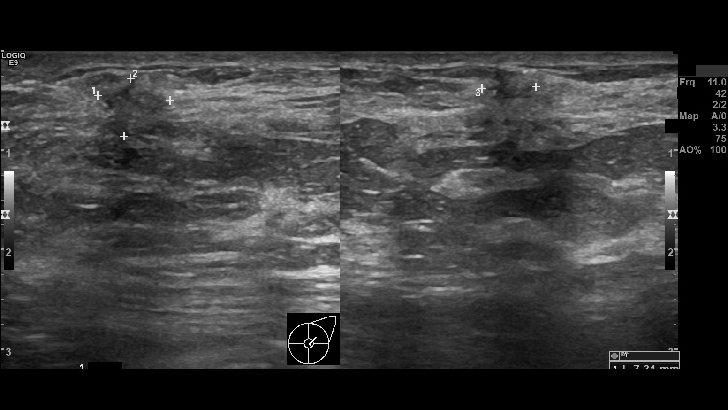
[im 17/20]
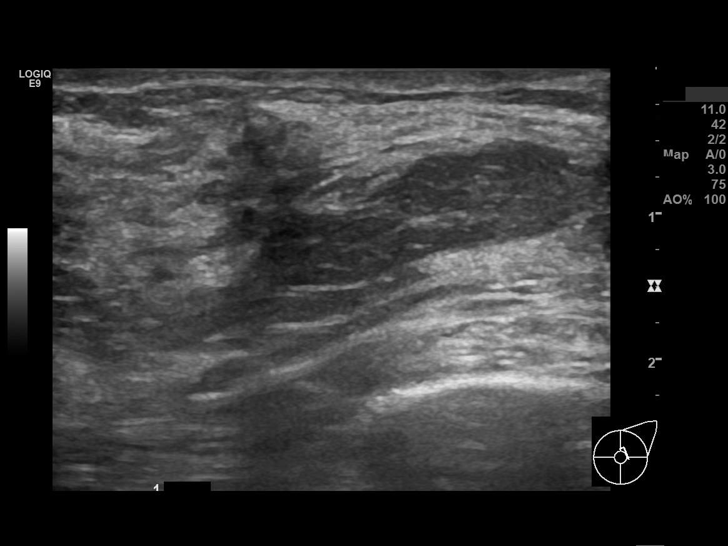
[im 18/20]
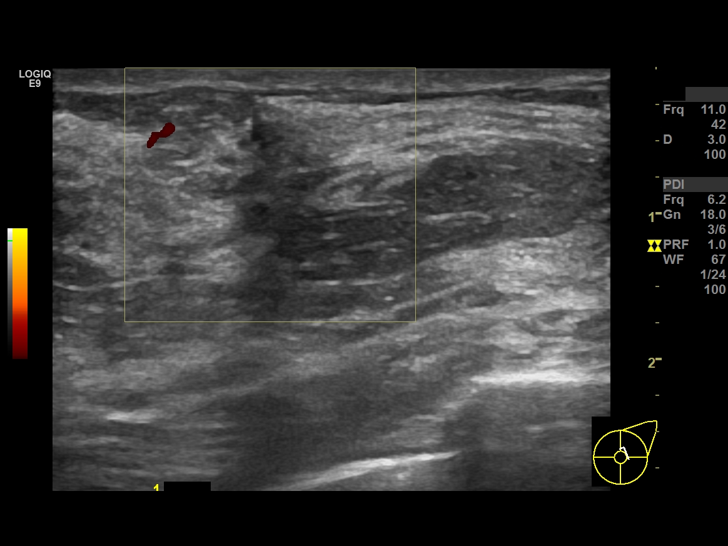
[im 20/20]
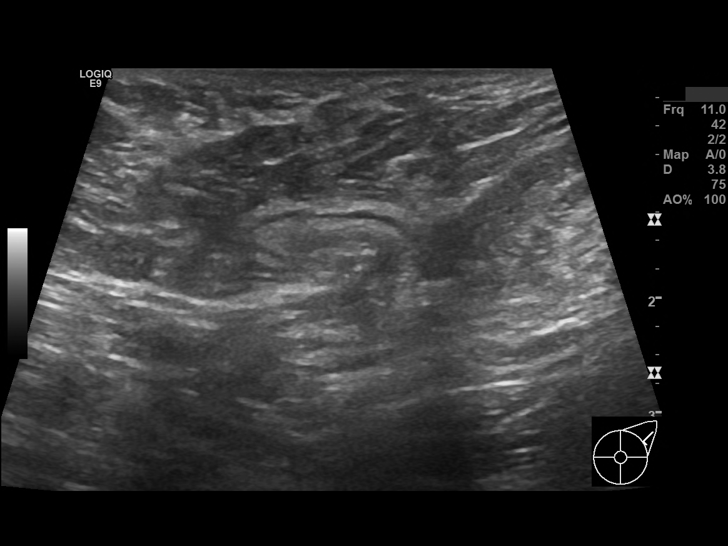

[14 of 20 positions shown; findings below may reference images not displayed]

FINDINGS: LEFT DIAGNOSTIC MAMMOGRAM:  The breast parenchyma is heterogeneously dense, which may obscure small masses. Additional views confirm a 0.8 cm architectural distortion with associated calcifications in the left breast at [DATE] retroareolar region.

TARGETED LEFT BREAST ULTRASOUND: There is a 0.7 x 0.6 x 0.5 cm irregular shadowing hypoechoic mass in the left breast [DATE] 9 cm from the nipple. This correlates with the mammographic distortion. No suspicious abnormality identified in the left axilla.
IMPRESSION: The 0.7 cm mass in the left breast [DATE] 9 cm from the nipple is suspicious. Ultrasound-guided biopsy is recommended.

Results and recommendations were discussed with the patient.

FINAL ASSESSMENT: BI-RADS: Category 4 Suspicious

## 2021-08-24 IMAGING — MG MAMMO DIAG (NO CHARGE)
4 series · 4 of 12 positions shown · non-contrast
Comparison: Multiple prior studies the most recent dated 08/13/2021

INDICATION: Left breast mass.

[L LM]
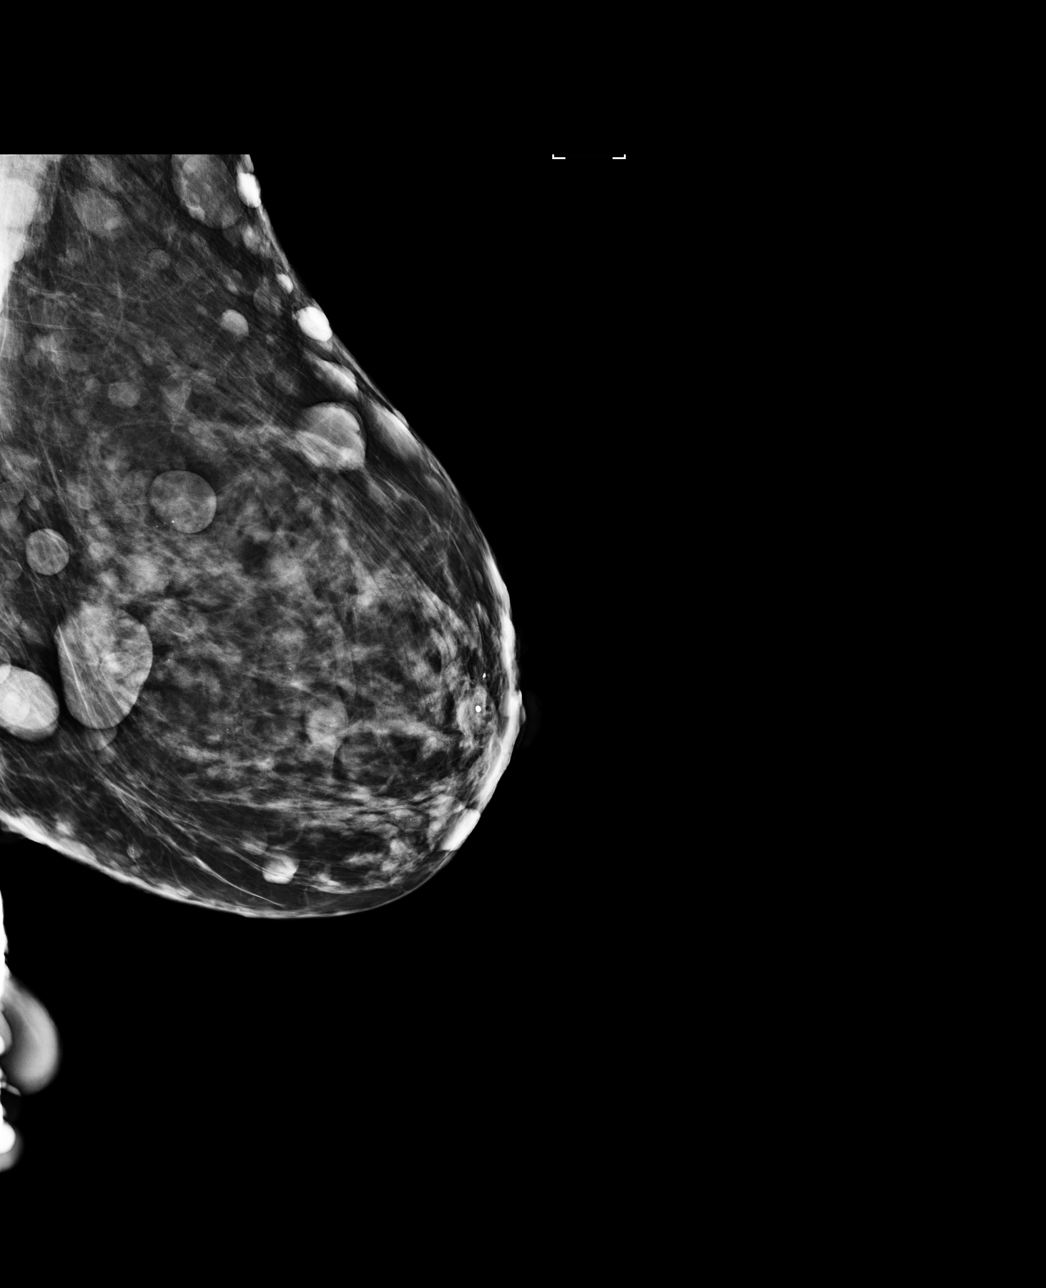

[L CC]
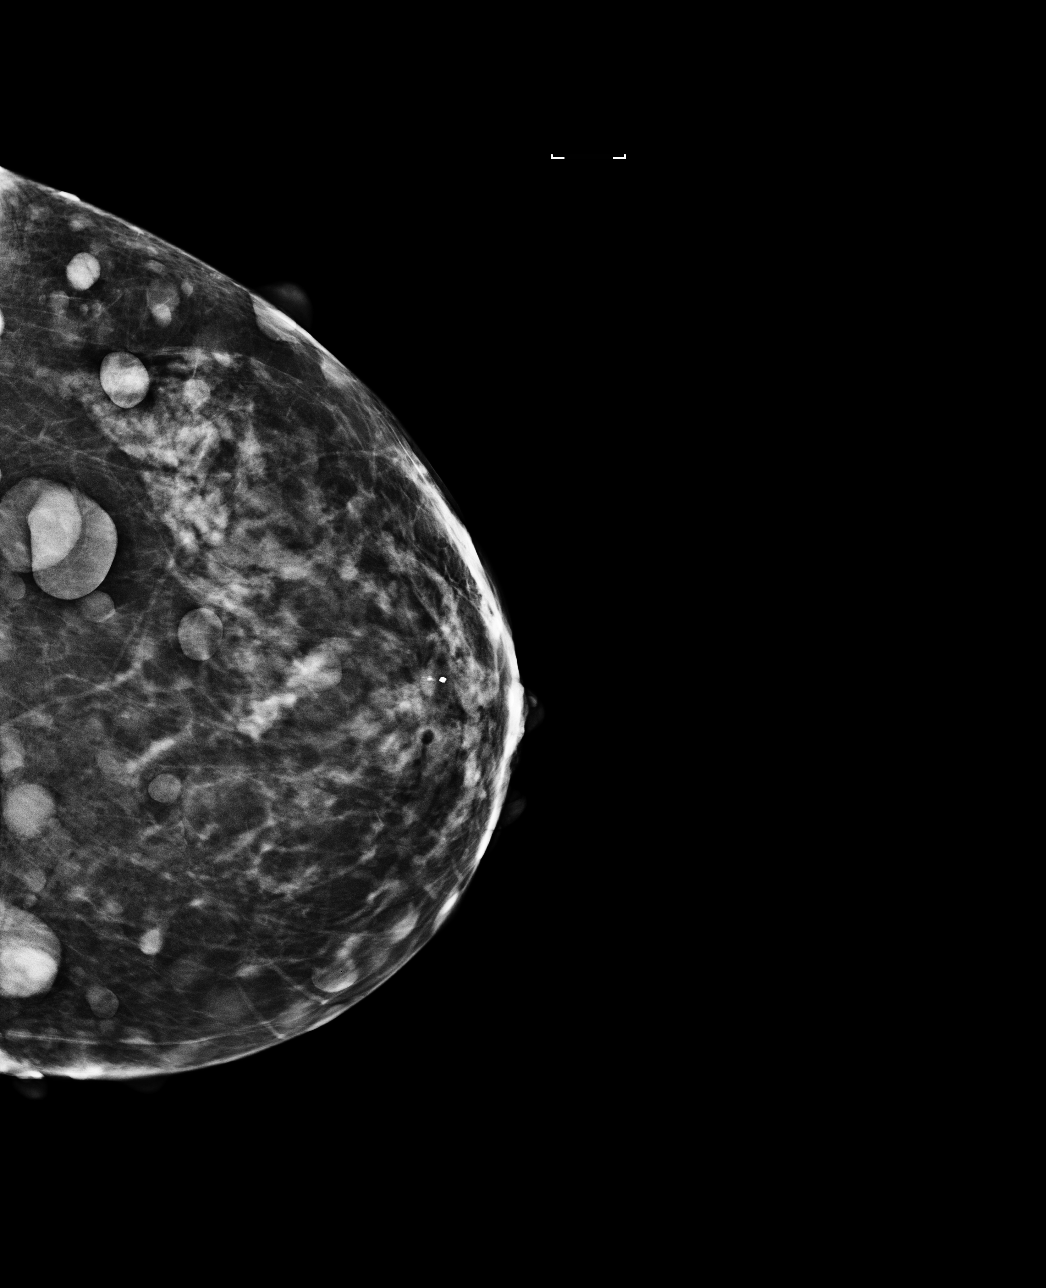

[L LM tomo · tomo slice 38/75.0]
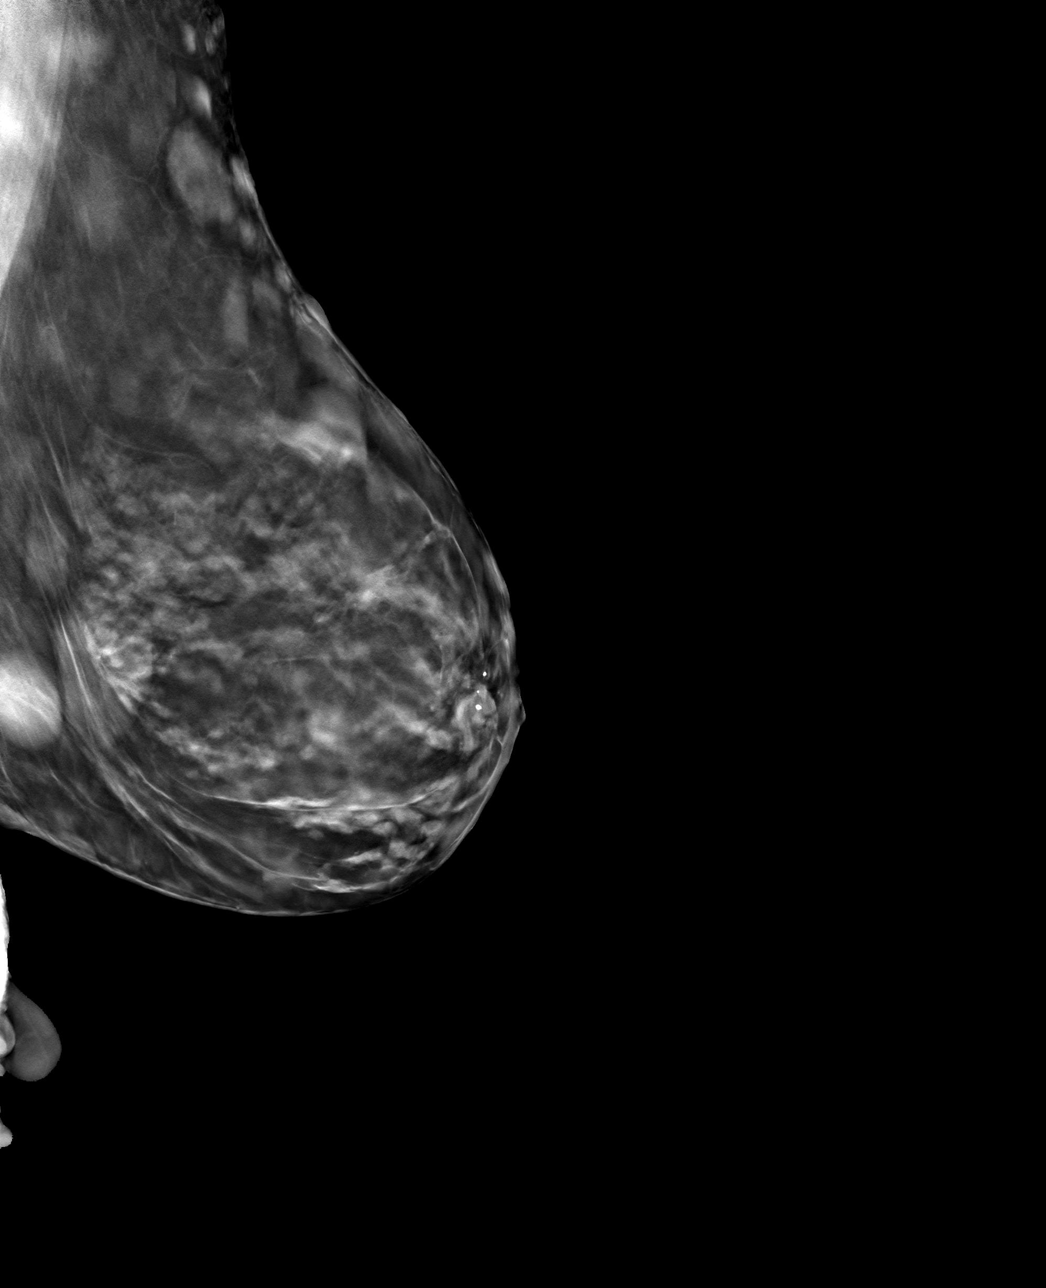

[L CC tomo · tomo slice 31/61.0]
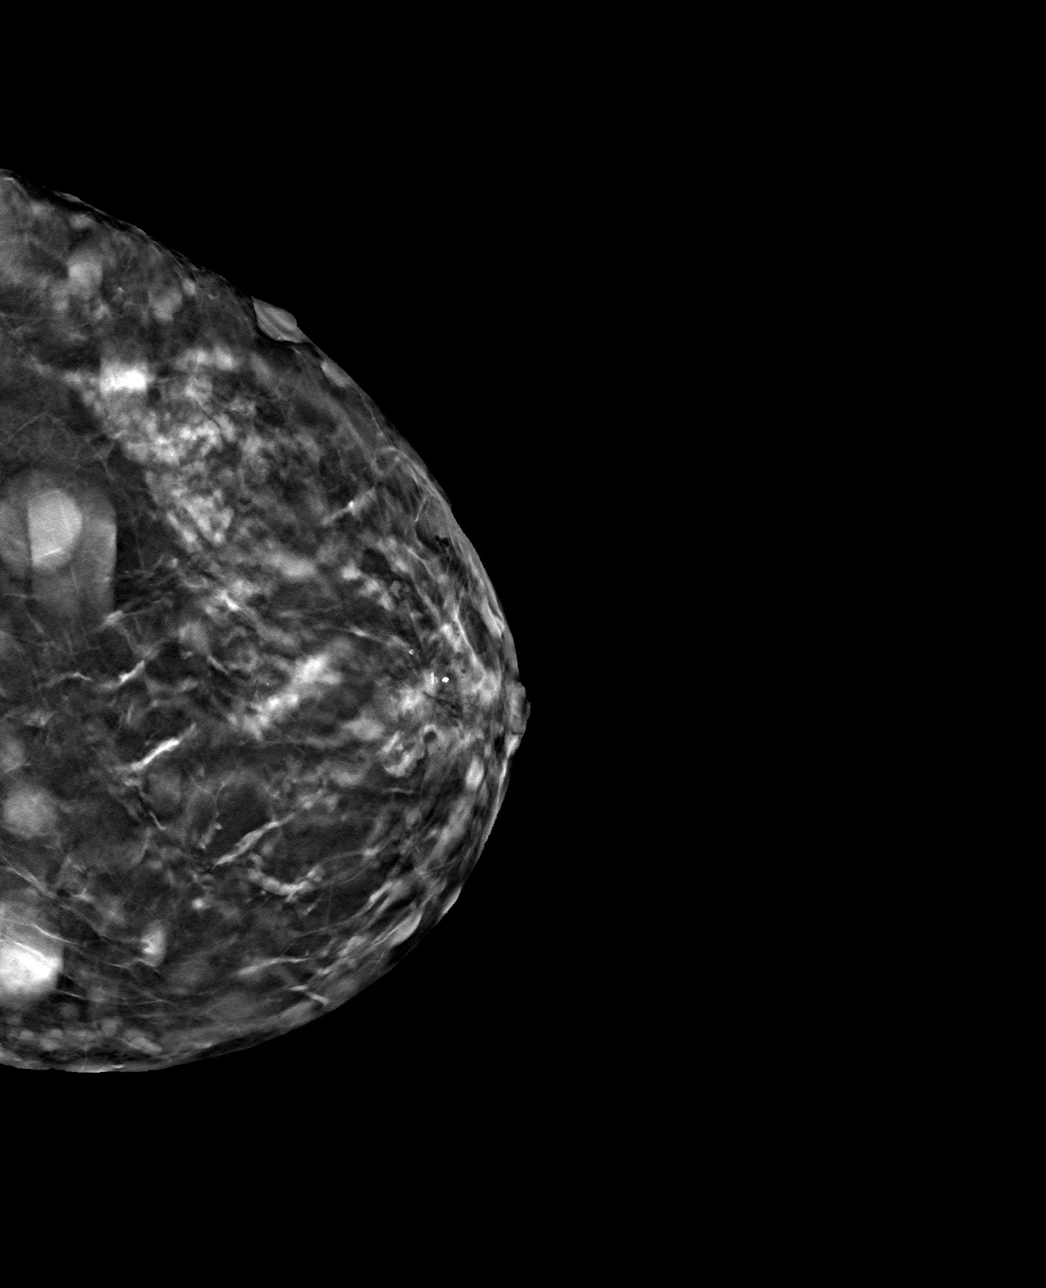

[4 of 12 positions shown; findings below may reference images not displayed]

PROCEDURE:

PATIENT CONSENT: Prior to the procedure risks, complications, alternatives and a description of the procedure were discussed. All questions were answered.  Informed consent was obtained and signed.  

PROCEDURE DESCRIPTION:  

An ultrasound-guided biopsy using real-time ultrasound was performed for the 0.7 cm mass in the left breast 1 o'clock 1 cm from the nipple.  This was described on the previous ultrasound report.  The skin was prepped in the usual manner.  Local anesthetic was administered to the access site.  A skin nick was made in the breast.  A 14 gauge biopsy needle was placed adjacent to the abnormality under ultrasound guidance.  Once the needle was documented to be in the correct location, three specimens were obtained using the Marquee biopsy needle.  An arrow-shaped Hydromark clip was inserted into the biopsy cavity.  A skin adhesive was applied to the access site.  Post procedure digital mammographic imaging with tomosynthesis demonstrates the clip at the targeted area.  The specimens were sent to the laboratory for pathological analysis.  

A female technologist was present throughout the procedure.
IMPRESSION: MALIGNANT

Ultrasound-guided biopsy of the 0.7 cm mass in the left breast at 1 o'clock 1 cm from the nipple was successful with no apparent post procedure complications.  Pathology indicates low-grade invasive mammary carcinoma. Radiology and pathology results are concordant. Surgical and oncologic consultations are recommended.

Results and recommendations were discussed with the patient.

STAT fax

## 2021-08-24 IMAGING — US BX BREAST US GUIDED LT
1 series · 13 of 19 positions shown · non-contrast
Comparison: Multiple prior studies the most recent dated 08/13/2021

INDICATION: Left breast mass.

[Series 1: bx breast us guided left · 13 of 19 slices shown]
[im 1/19]
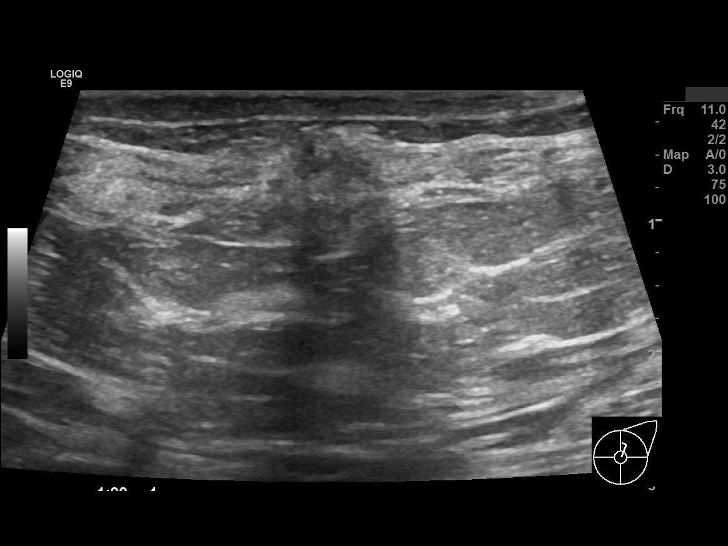
[im 3/19]
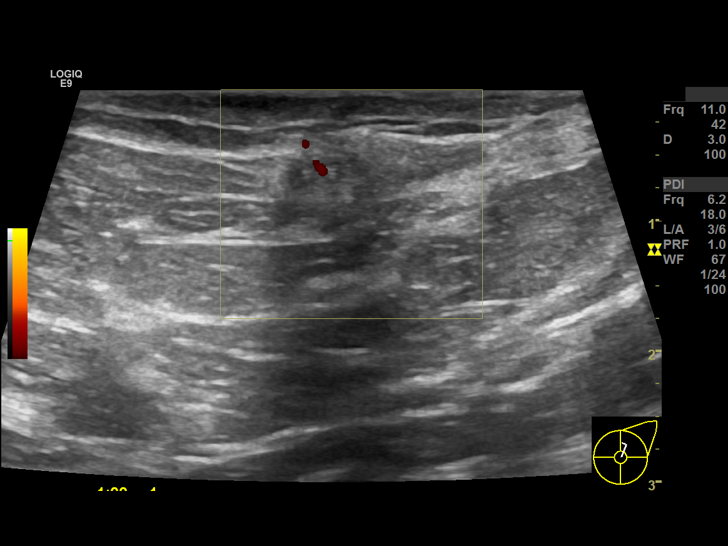
[im 4/19]
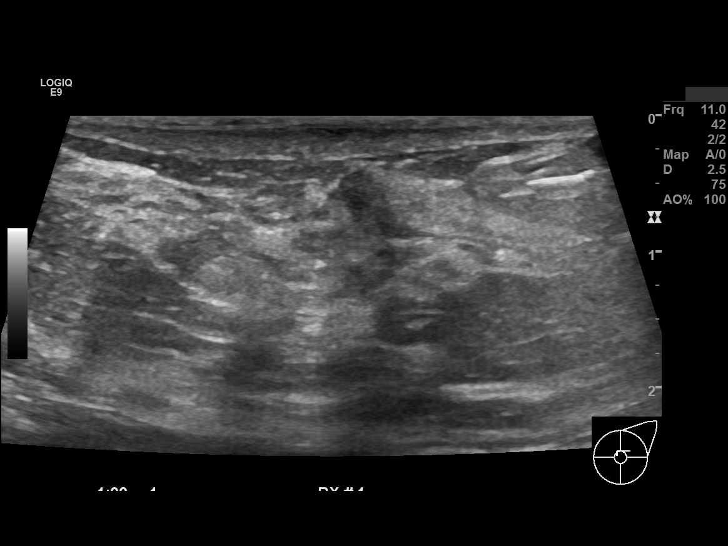
[im 6/19]
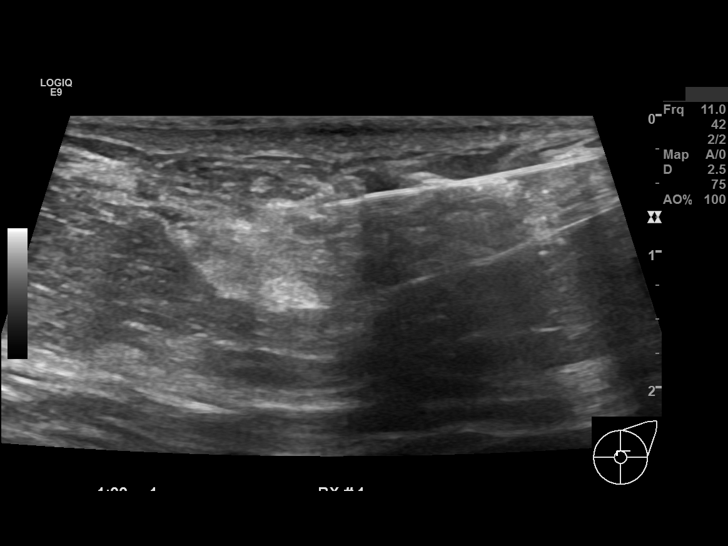
[im 7/19]
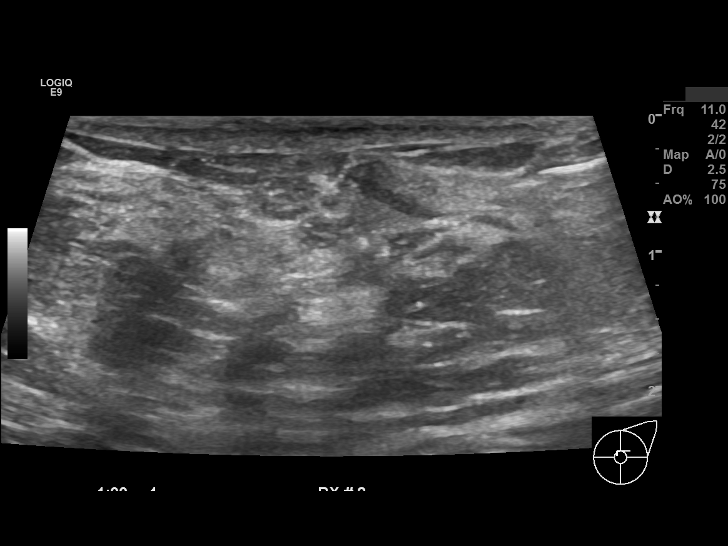
[im 9/19]
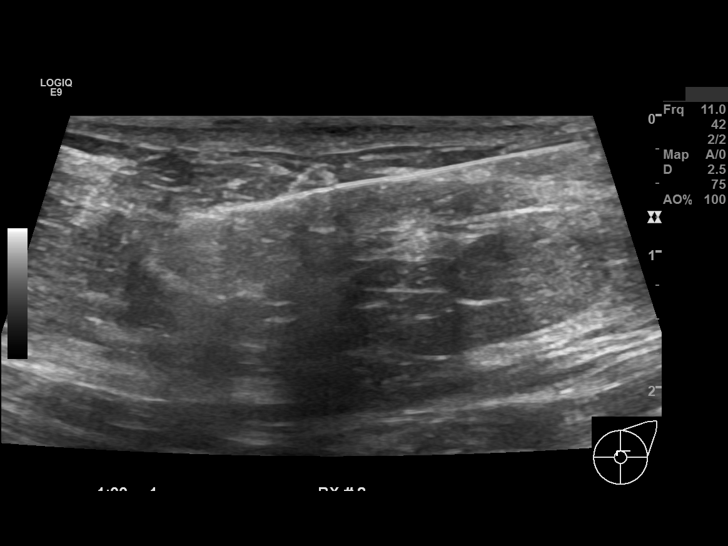
[im 10/19]
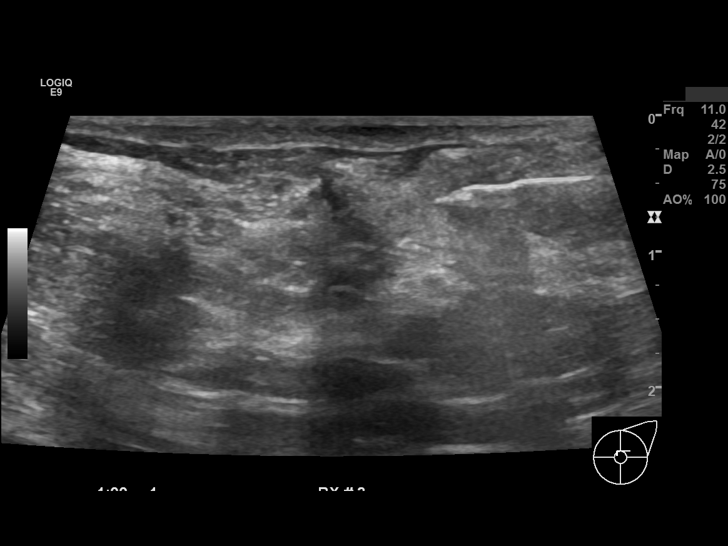
[im 11/19]
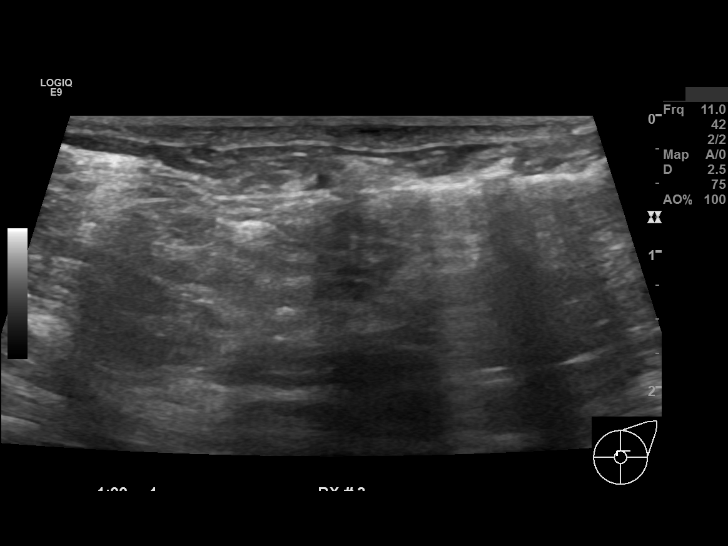
[im 13/19]
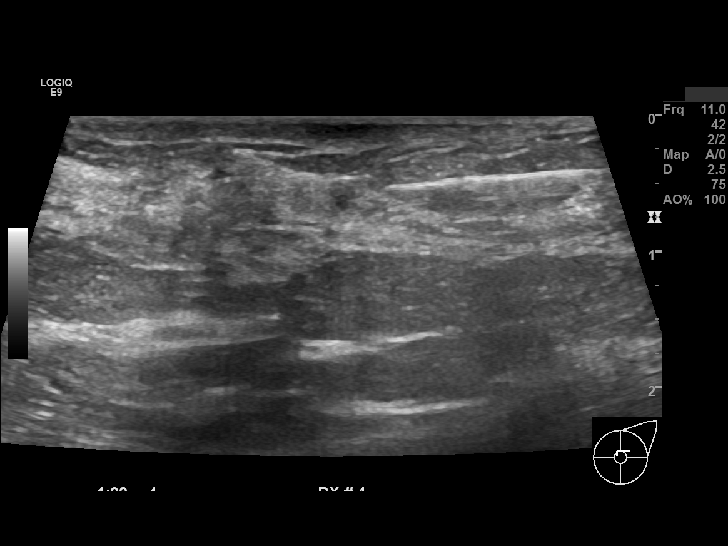
[im 14/19]
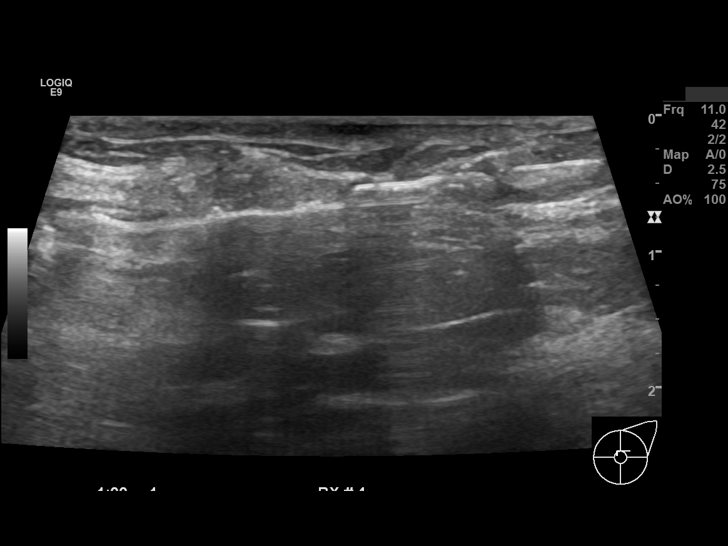
[im 16/19]
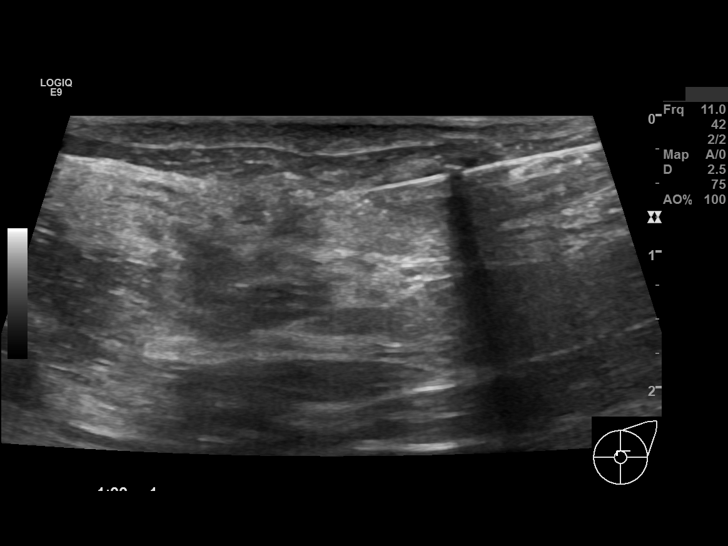
[im 17/19]
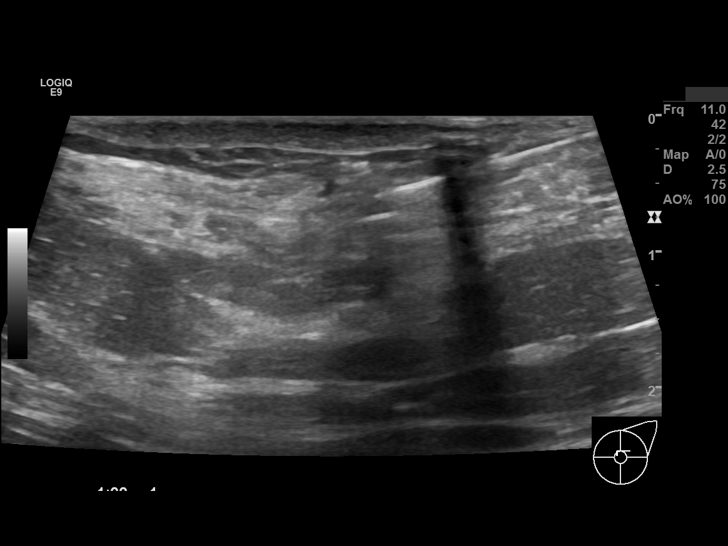
[im 19/19]
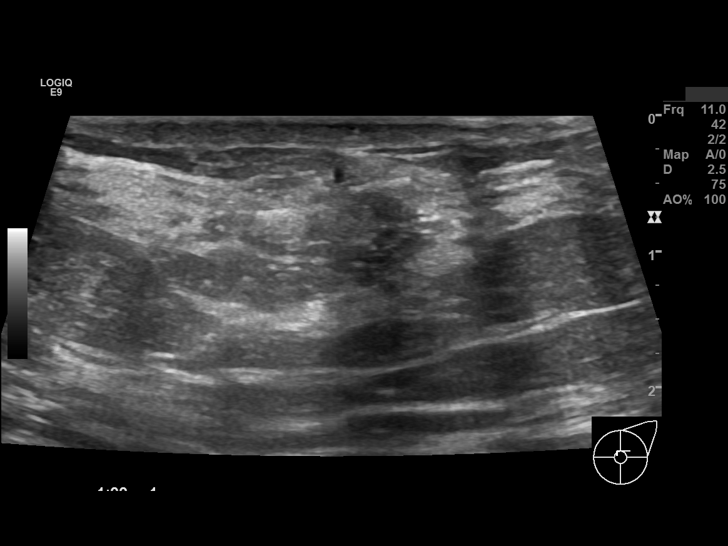

[13 of 19 positions shown; findings below may reference images not displayed]

PROCEDURE:

PATIENT CONSENT: Prior to the procedure risks, complications, alternatives and a description of the procedure were discussed. All questions were answered.  Informed consent was obtained and signed.  

PROCEDURE DESCRIPTION:  

An ultrasound-guided biopsy using real-time ultrasound was performed for the 0.7 cm mass in the left breast 1 o'clock 1 cm from the nipple.  This was described on the previous ultrasound report.  The skin was prepped in the usual manner.  Local anesthetic was administered to the access site.  A skin nick was made in the breast.  A 14 gauge biopsy needle was placed adjacent to the abnormality under ultrasound guidance.  Once the needle was documented to be in the correct location, three specimens were obtained using the Marquee biopsy needle.  An arrow-shaped Hydromark clip was inserted into the biopsy cavity.  A skin adhesive was applied to the access site.  Post procedure digital mammographic imaging with tomosynthesis demonstrates the clip at the targeted area.  The specimens were sent to the laboratory for pathological analysis.  

A female technologist was present throughout the procedure.
IMPRESSION: MALIGNANT

Ultrasound-guided biopsy of the 0.7 cm mass in the left breast at 1 o'clock 1 cm from the nipple was successful with no apparent post procedure complications.  Pathology indicates low-grade invasive mammary carcinoma. Radiology and pathology results are concordant. Surgical and oncologic consultations are recommended.

Results and recommendations were discussed with the patient.

STAT fax

## 2021-10-06 IMAGING — MR MRI BREAST BIL WO-W CONTRAST W CAD
6 of 10 series · 29 of 48 positions shown · IV contrast (15cc prohance)
Comparison: Previous mammogram and ultrasound exams including diagnostic exam 08/13/2021 and left breast ultrasound-guided biopsy 08/23/2021

INDICATION: Recently diagnosed invasive mammary carcinoma left breast
TECHNIQUE: Bilateral MRI of the breasts was performed before and after 15 cc of ProHance intravenous contrast. Current study was also evaluated with a computer aided detection (CAD) system.

[Series 2: t1_axial_bil_in · axial · 1.0mm · 0.99mm/px · z∈[-109,+66]mm · 5 of 174 slices shown]
[im 1/174]
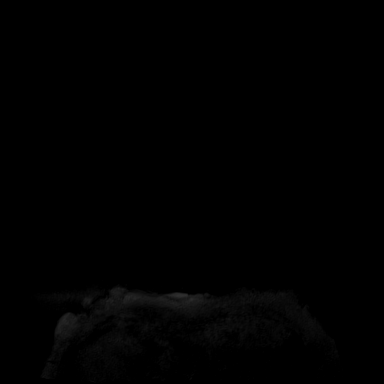
[im 44/174]
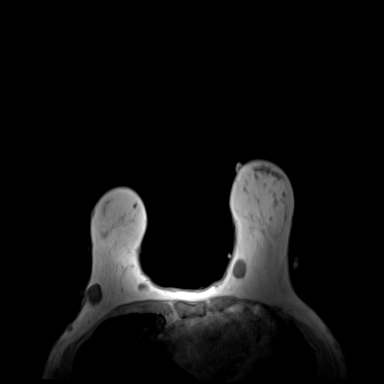
[im 87/174]
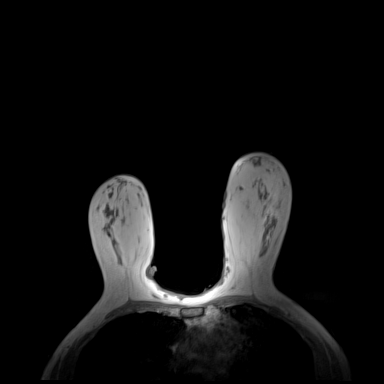
[im 130/174]
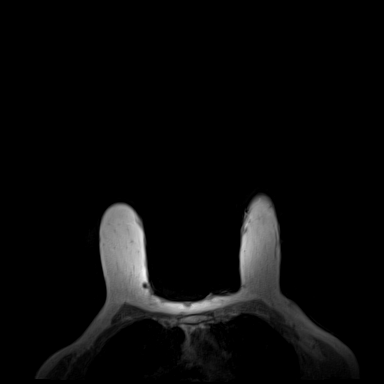
[im 174/174]
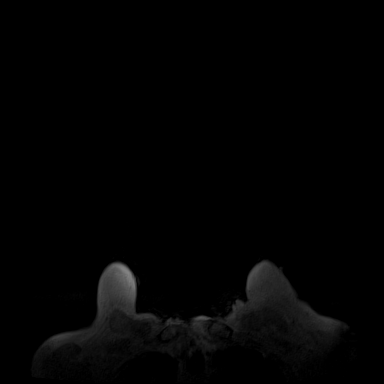

[Series 6: ir_axial_bil · axial · 3.0mm · 0.74mm/px · 1 of 60 slices shown]
[im 1/60]
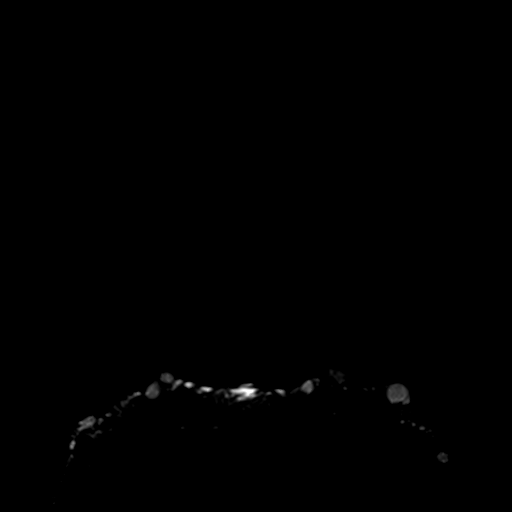

[Series 7: dynamic_t1_pre · axial · 1.0mm · 0.91mm/px · z∈[-109,+66]mm · 6 of 176 slices shown]
[im 1/176]
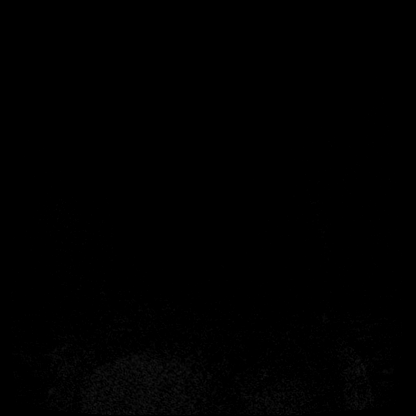
[im 36/176]
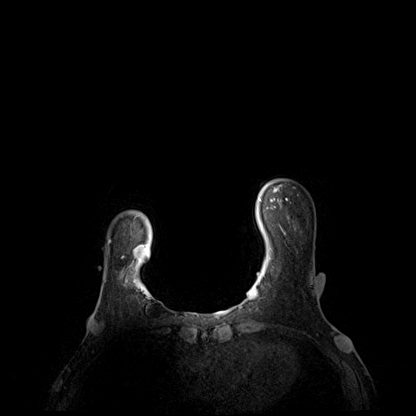
[im 71/176]
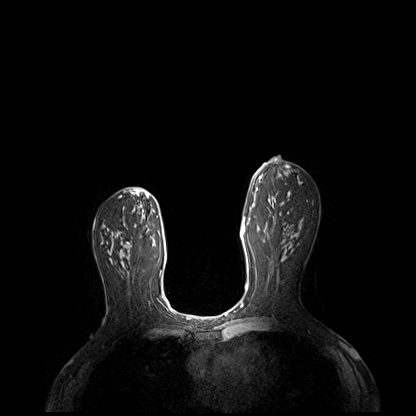
[im 106/176]
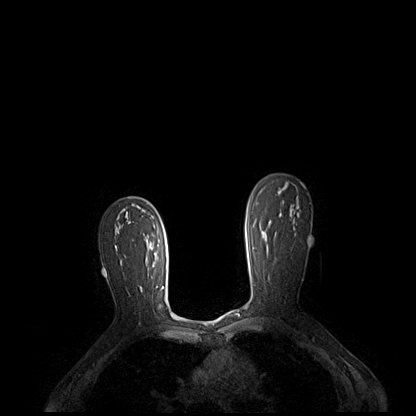
[im 141/176]
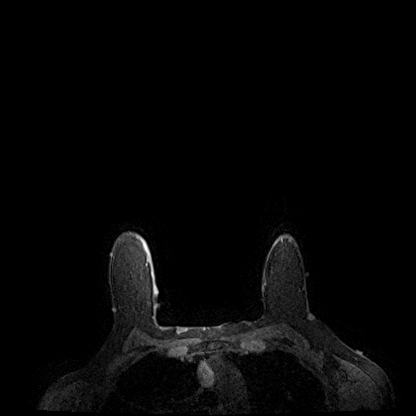
[im 176/176]
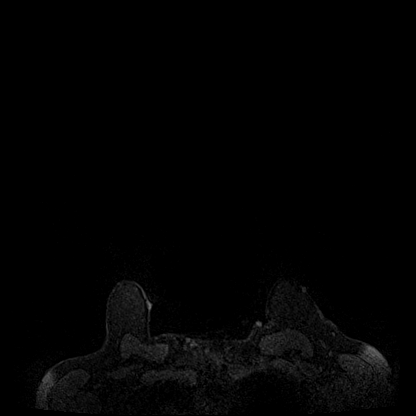

[Series 8: dynamic_t1_1 · axial · 1.0mm · 0.91mm/px · z∈[-109,+66]mm · 6 of 176 slices shown]
[im 1/176]
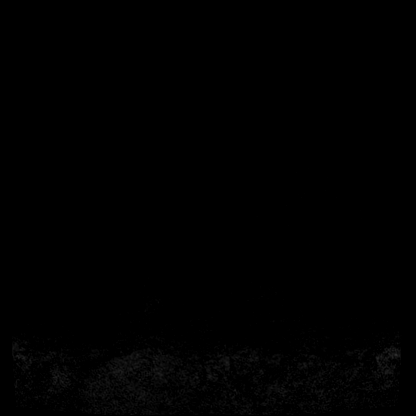
[im 36/176]
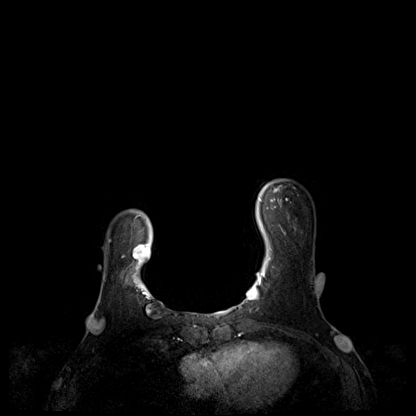
[im 71/176]
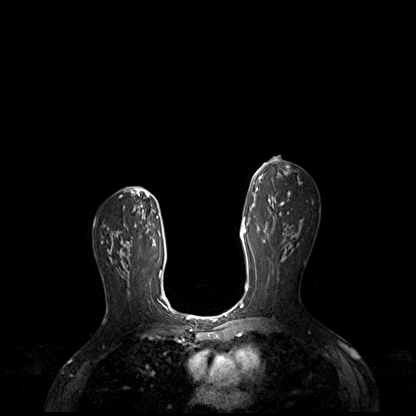
[im 106/176]
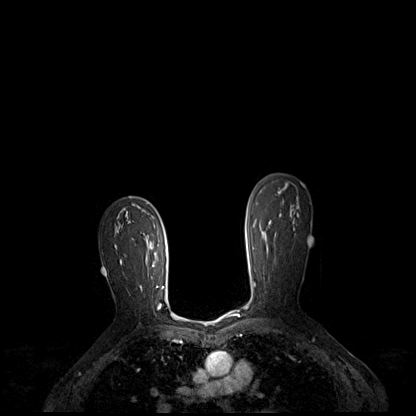
[im 141/176]
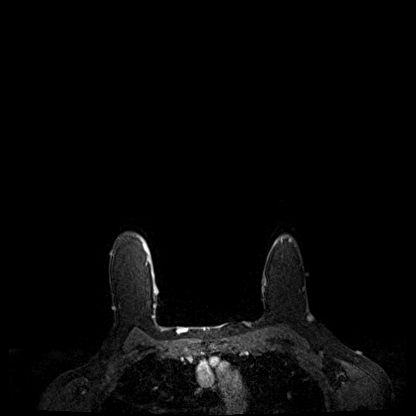
[im 176/176]
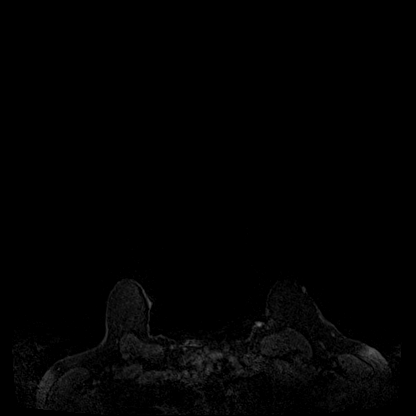

[Series 9: dynamic_t1_2 · axial · 1.0mm · 0.91mm/px · z∈[-109,+66]mm · 6 of 176 slices shown]
[im 1/176]
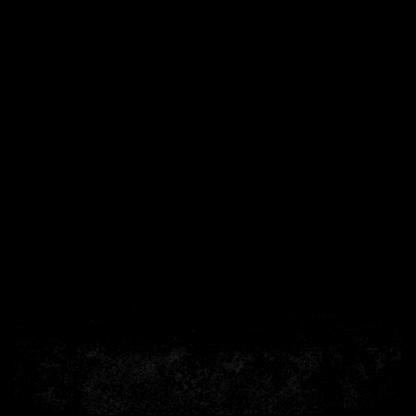
[im 36/176]
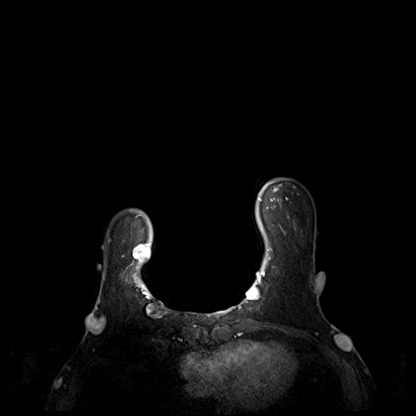
[im 71/176]
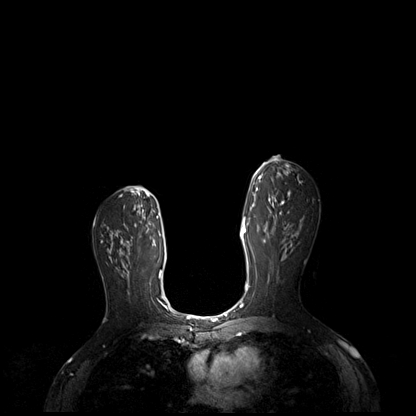
[im 106/176]
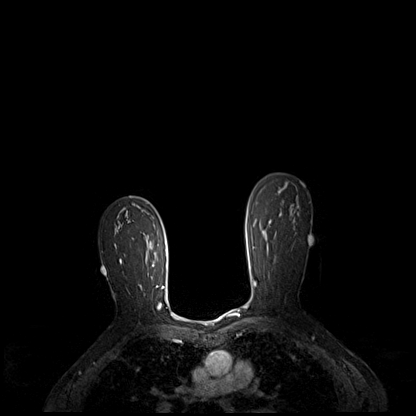
[im 141/176]
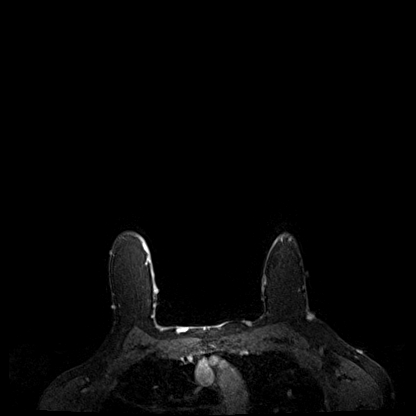
[im 176/176]
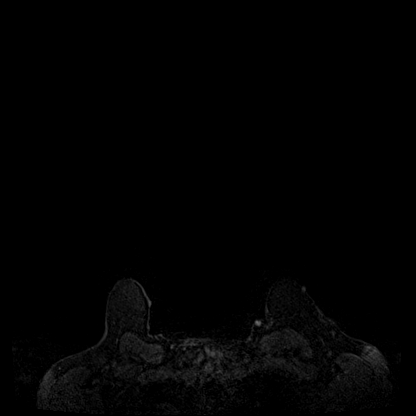

[Series 10: dynamic_t1_3 · axial · 1.0mm · 0.91mm/px · z∈[-109,+31]mm · 5 of 176 slices shown]
[im 1/176]
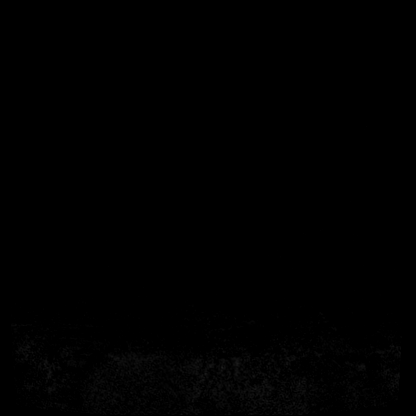
[im 36/176]
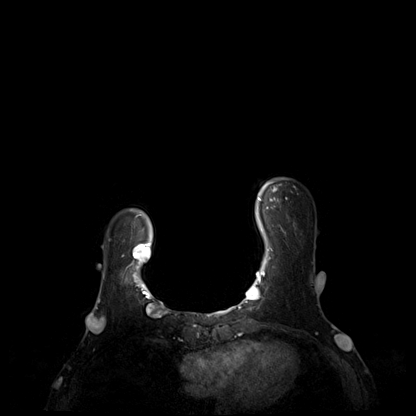
[im 71/176]
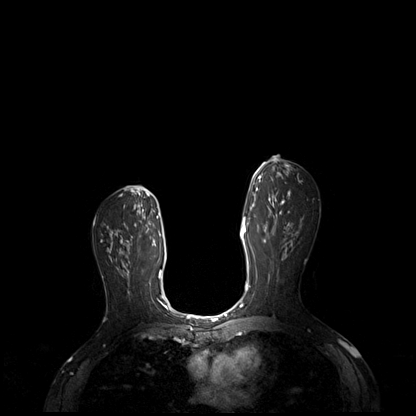
[im 106/176]
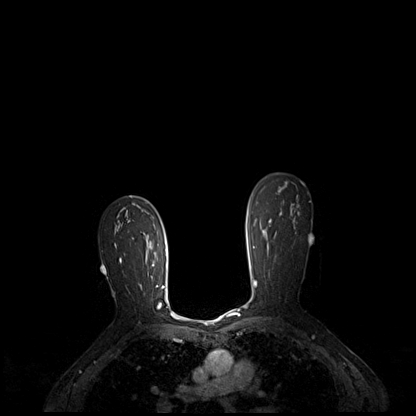
[im 141/176]
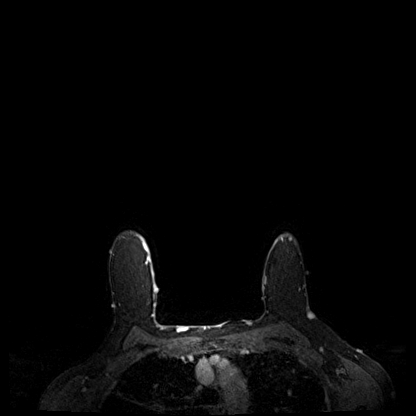

[29 of 48 positions shown; findings below may reference images not displayed]

FINDINGS: RIGHT BREAST: There is heterogeneous fibroglandular tissue. There is mild background parenchymal enhancement. 

In the [DATE] region of the anterior right breast approximately 2.3 cm from the nipple there is a 0.5-0.6 cm small mildly enhancing mass. (Please reference post contrast axial image #65).

The nipple areola complex are unremarkable. No enlarged axillary or internal mammary lymph nodes. There are innumerable T2 bright skin lesions in this patient with a history of neurofibromatosis.

LEFT BREAST: There is heterogeneous fibroglandular tissue. There is mild background parenchymal enhancement.

In the retroareolar left breast there is a 0.6 x 0.3 x 0.4 cm mildly enhancing mass with an associated biopsy site marker corresponding to the site of the patient's recently diagnosed invasive mammary carcinoma.

The nipple areola complex are unremarkable. No enlarged axillary or internal mammary lymph nodes. There are innumerable T2 bright skin lesions in this patient with a history of neurofibromatosis.
IMPRESSION: 1.
Small retroareolar left breast mass corresponding to the site of the patient's recently diagnosed breast cancer.

2.
Small 0.5 x 0.6 cm mildly enhancing mass in the [DATE] region of the retroareolar right breast. Suggest follow-up targeted right breast ultrasound with possible same day biopsy if the lesion can be identified. If not, MRI biopsy may be required.

3.
Innumerable skin lesions in this patient with a history of neurofibromatosis.

FINAL ASSESSMENT: BI-RADS Category 4: Suspicious

## 2021-10-19 IMAGING — US US BREAST RT LTD
1 series · 7 of 7 positions shown · non-contrast
Comparison: Multiple priors studies, most recent dated 10/06/2021

INDICATION: Abnormal breast MRI. History of left breast cancer.
TECHNIQUE: Real-time and Doppler ultrasound of the right breast was performed.

[Series 2: us breast right ltd · 7 of 7 slices shown]
[im 1/7]
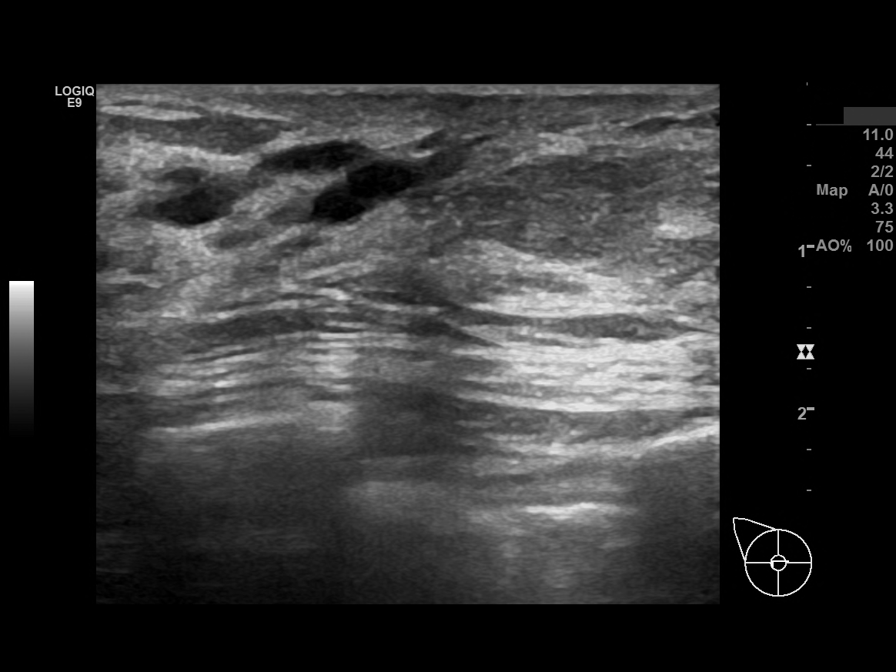
[im 2/7]
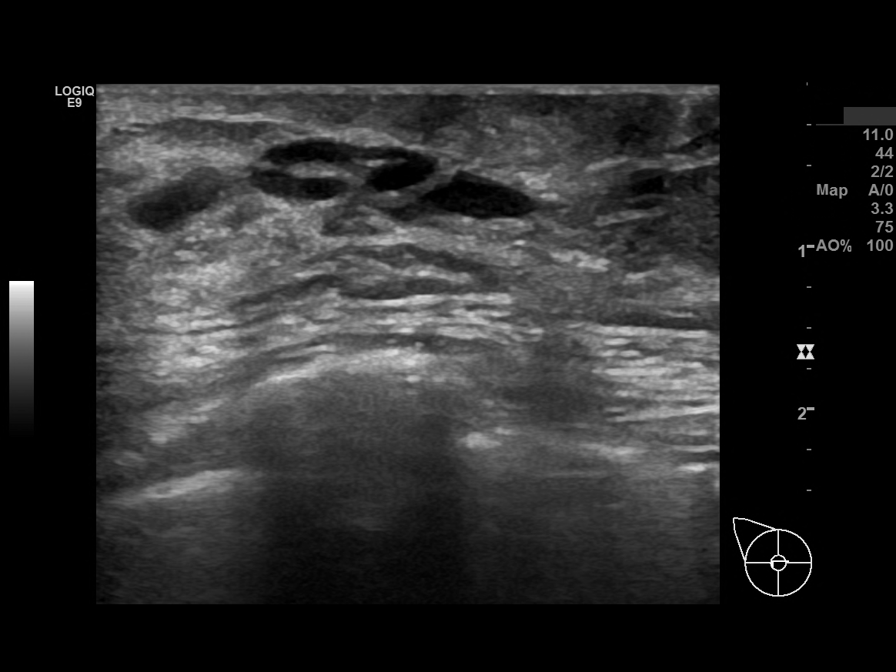
[im 3/7]
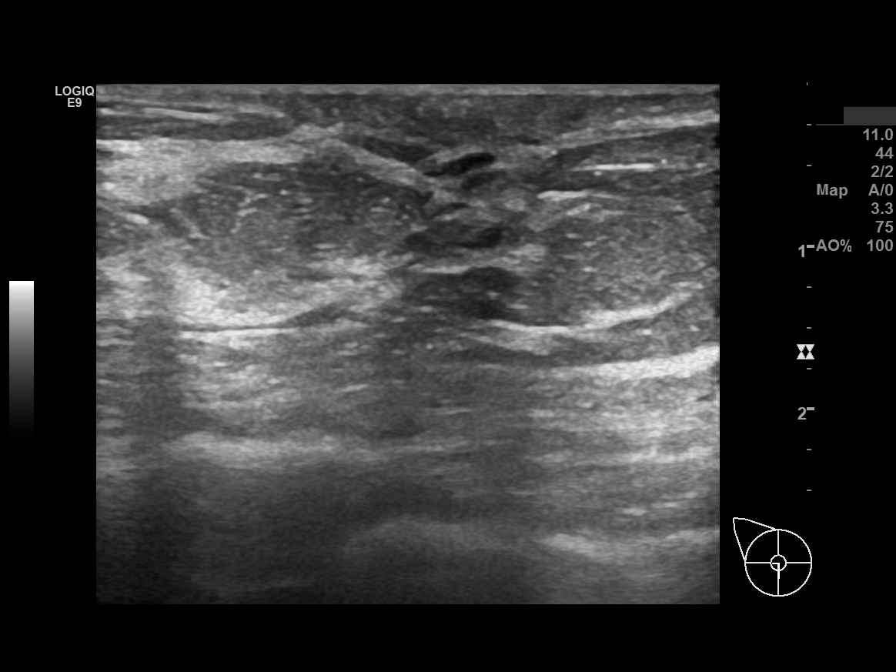
[im 4/7]
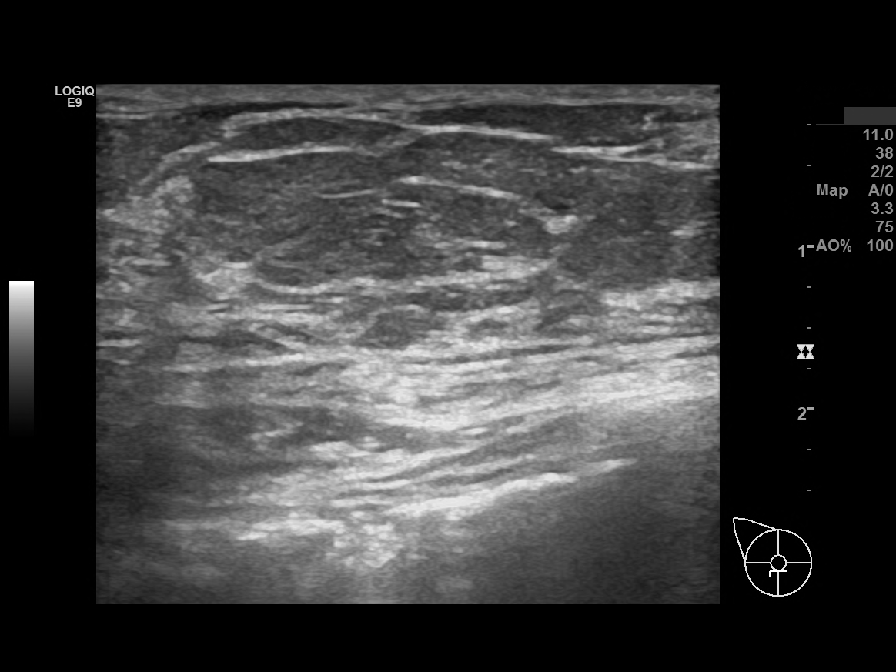
[im 5/7]
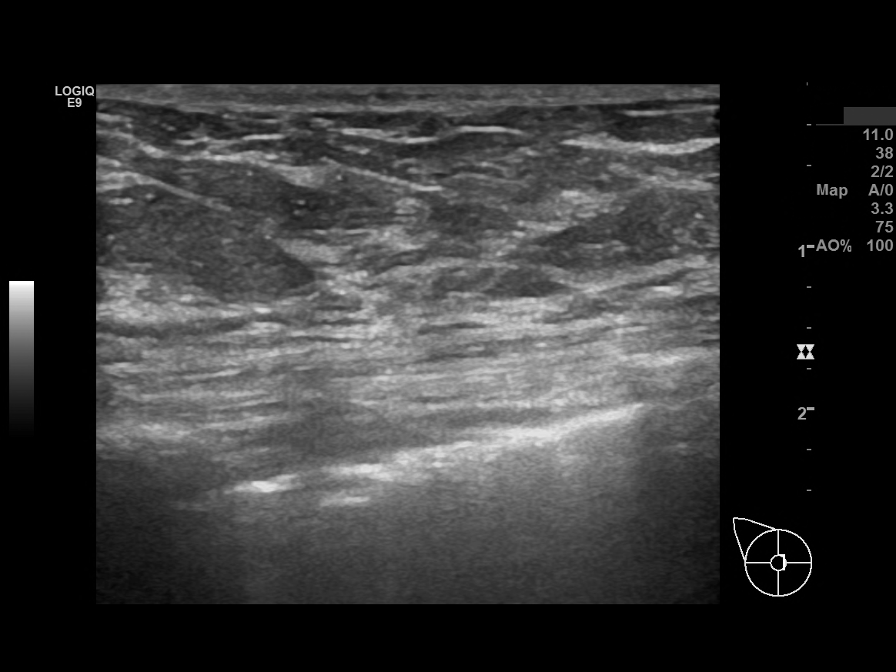
[im 6/7]
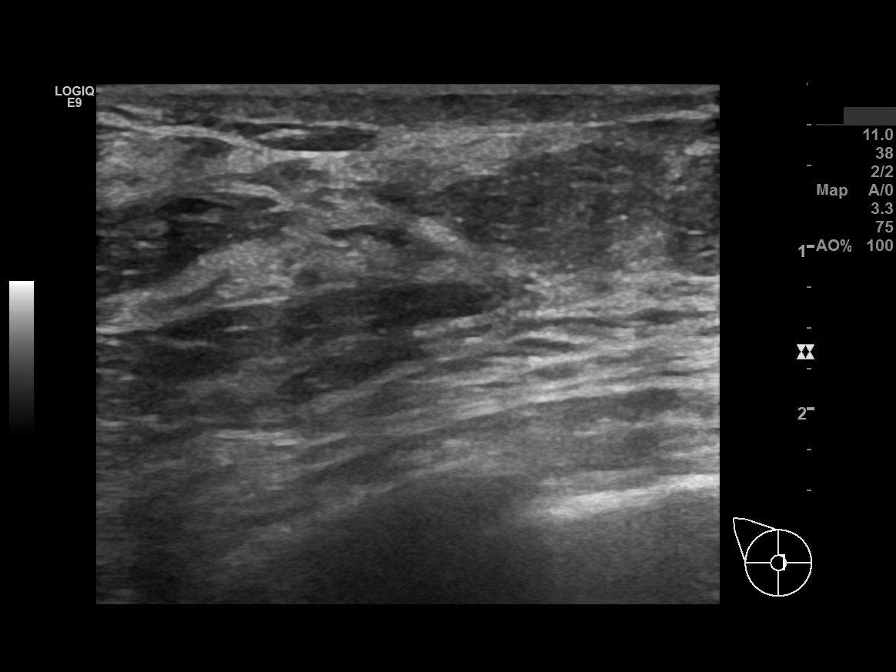
[im 7/7]
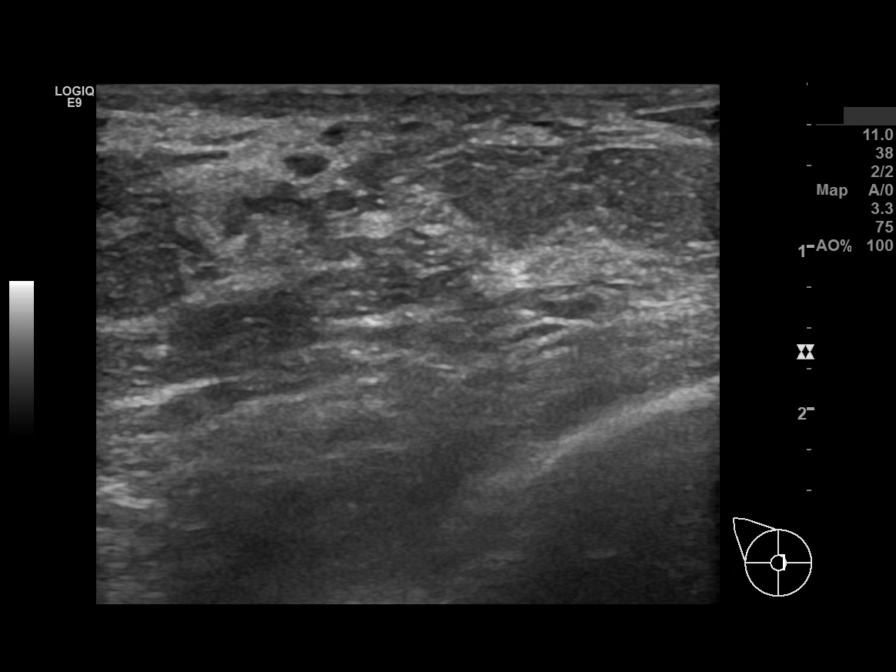

[7 of 7 positions shown; findings below may reference images not displayed]

FINDINGS: No suspicious sonographic abnormality identified in the retroareolar and periareolar right breast to correlate with the reported MRI mass. There are few benign cysts in the vicinity.
IMPRESSION: No suspicious ultrasound correlate to the enhancing mass in the retroareolar right breast reported on recent breast MRI. An MRI guided biopsy is recommended.

Results and recommendations were discussed with the patient.

FINAL ASSESSMENT: BI-RADS: Category 4 Suspicious

## 2021-11-03 IMAGING — MG MAMMO DIAG RT
2 series · 2 of 2 positions shown · non-contrast
Comparison: Previous breast MRI 10/06/2021 and previous targeted right breast ultrasound 10/19/2021

INDICATION: Right breast mass. Recently diagnosed left breast cancer

[R CC]
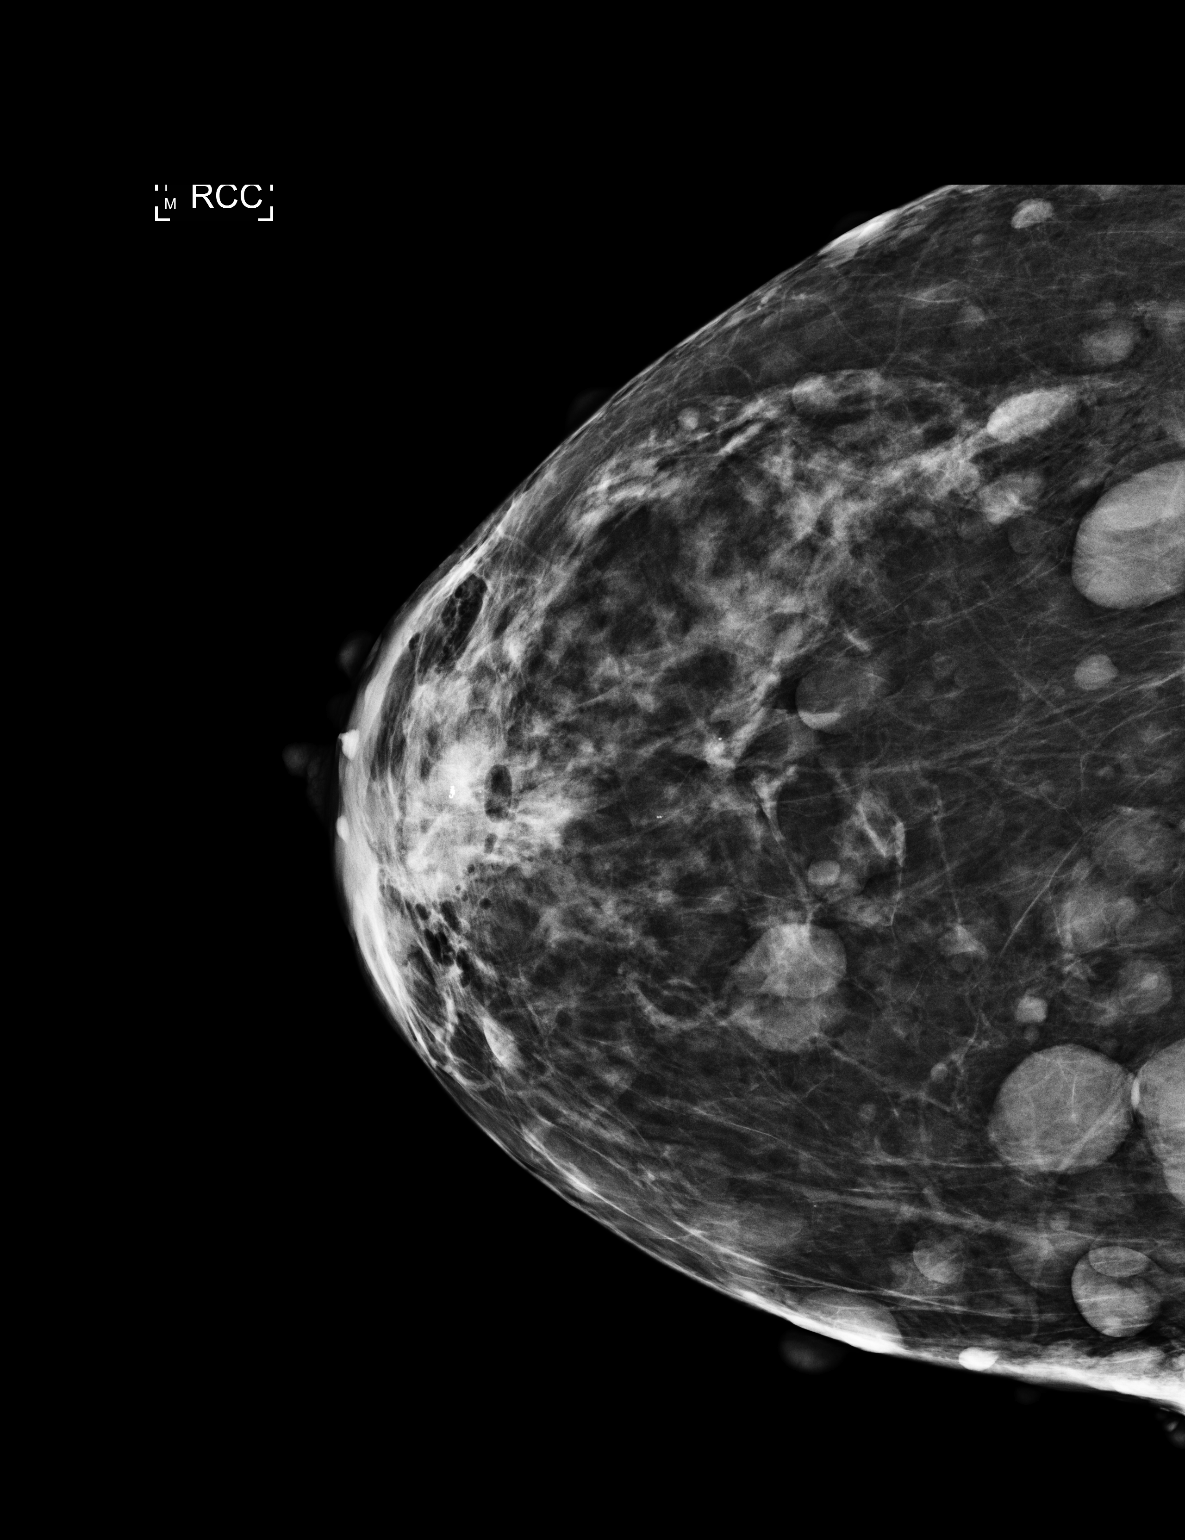

[R ML]
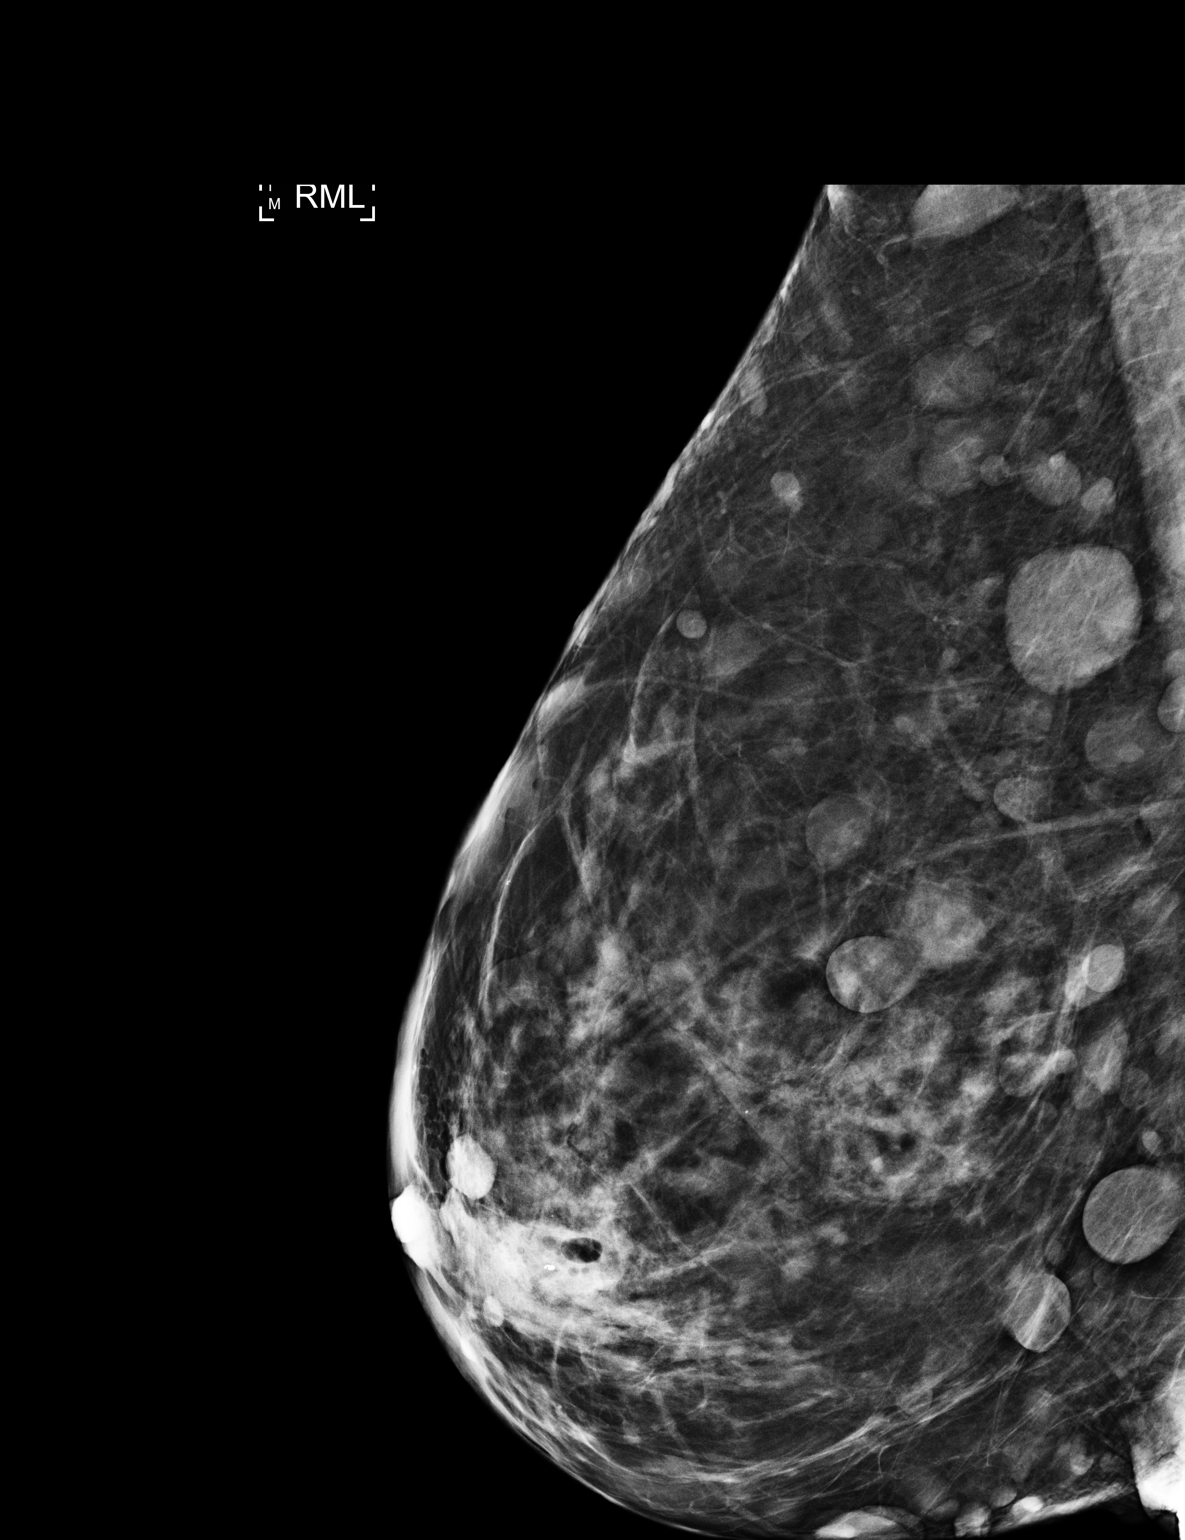

[2 of 2 positions shown; findings below may reference images not displayed]

DESCRIPTION OF PROCEDURE: The patient was positioned prone in the MRI machine and the right breast was placed in mild grid compression. Serial MRI imaging of the breast was performed after the injection of 15 mL Prohance intravenous contrast. The small 0.6 x 0.5 cm enhancing mass in the [DATE] retroareolar region of the right breast was again identified. This was targeted utilizing the CAD stream targeting software and a lateral approach was chosen. The patient's skin was cleaned with Betadine and local anesthesia was administered with 1% lidocaine. Using trocar technique a needle guide was placed and positioning confirmed. Subsequently utilizing a Suros Eviva 9-gauge vacuum-assisted device multiple specimens were obtained. Post sampling images confirmed successful sampling of the targeted area. A HydroMARK butterfly-shaped marker was placed. Postprocedure MRI imaging as well as post procedure digital mammography confirmed successful marker placement at the targeted area. Samples were placed in formalin and sent to the laboratory for pathology evaluation. The patient tolerated the procedure well. Hemostasis was achieved with manual compression and bandage strips were applied.
IMPRESSION: 1.
 Successful ultrasound guided biopsy of a small 0.5 x 0.6 cm enhancing mass at the [DATE] retroareolar region of the right breast.

2.
Pathology results reveal benign findings to include a partially hyalinized intraductal papilloma and fibrocystic changes. No atypia or malignancy seen.

3.
Results were called to the patient on 11/04/2021.
SUMMARY: Benign

Stat fax

## 2021-11-03 IMAGING — MR MRI GUIDED RT BREAST BX
6 of 7 series · 38 of 48 positions shown · IV contrast (15cc prohance)
Comparison: Previous breast MRI 10/06/2021 and previous targeted right breast ultrasound 10/19/2021

INDICATION: Right breast mass. Recently diagnosed left breast cancer

[Series 4: t1_(person_name)3(person_name)_(person_name)_(person_name)_(person_name)_pre · axial · 1.0mm · 0.94mm/px · z∈[-90,+69]mm · 6 of 160 slices shown]
[im 1/160]
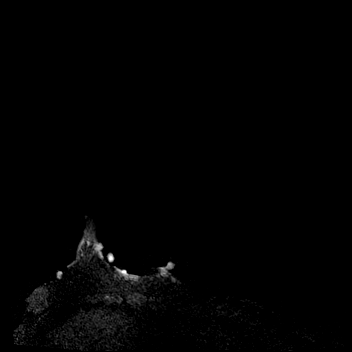
[im 32/160]
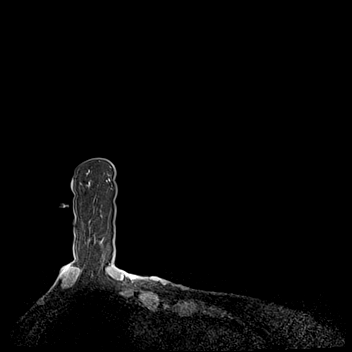
[im 64/160]
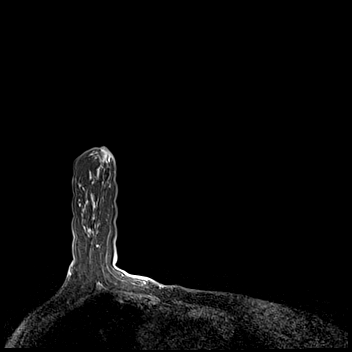
[im 96/160]
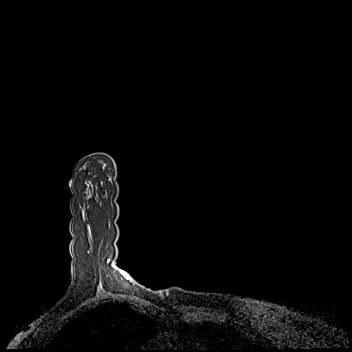
[im 128/160]
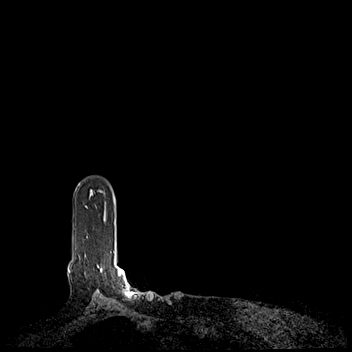
[im 160/160]
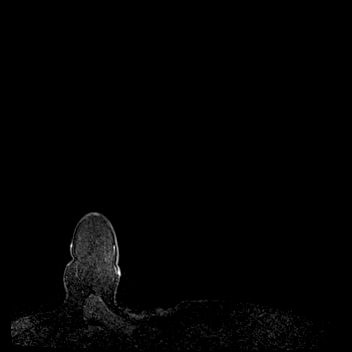

[Series 5: t1_(person_name)3(person_name)_(person_name)_(person_name)_(person_name)_+c · axial · 1.0mm · 0.94mm/px · z∈[-90,+69]mm · 7 of 160 slices shown (1 of 3)]
[im 1/160]
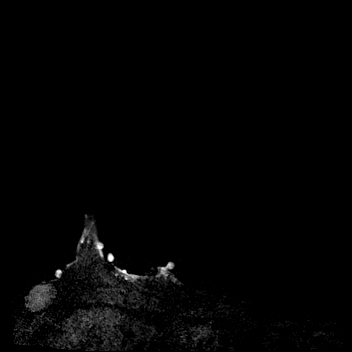
[im 27/160]
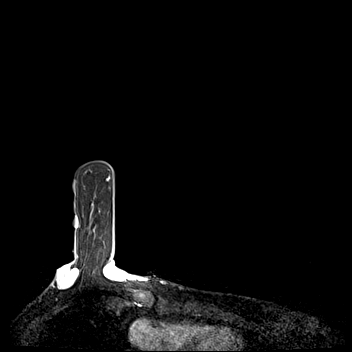
[im 54/160]
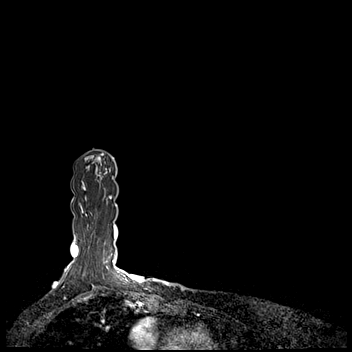
[im 80/160]
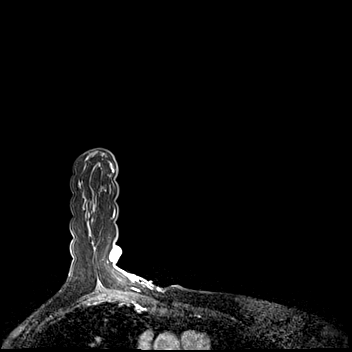
[im 107/160]
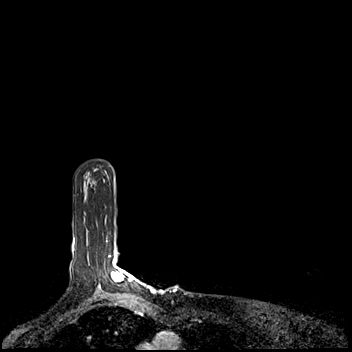
[im 133/160]
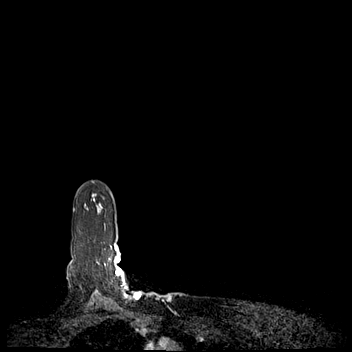
[im 160/160]
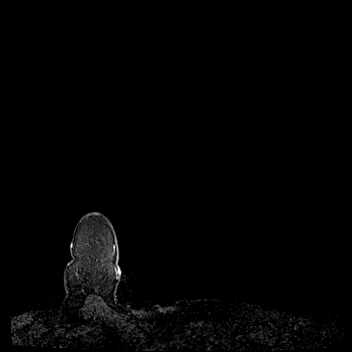

[Series 6: t1_(person_name)3(person_name)_(person_name)_(person_name)_(person_name)_+c · axial · 1.0mm · 0.94mm/px · z∈[-90,+69]mm · 7 of 160 slices shown (2 of 3)]
[im 1/160]
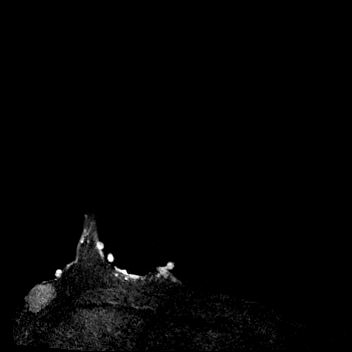
[im 27/160]
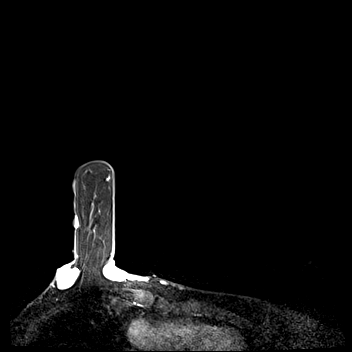
[im 54/160]
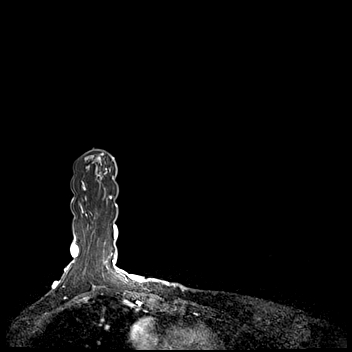
[im 80/160]
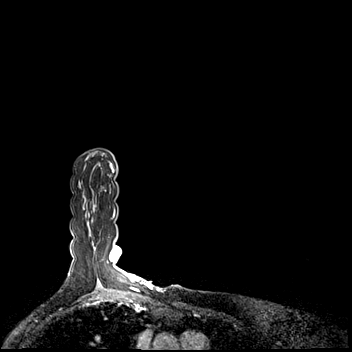
[im 107/160]
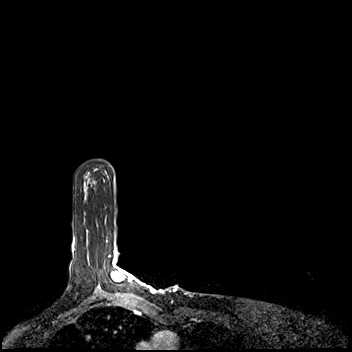
[im 133/160]
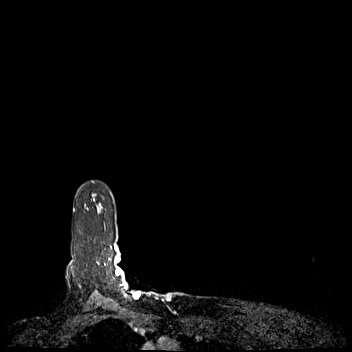
[im 160/160]
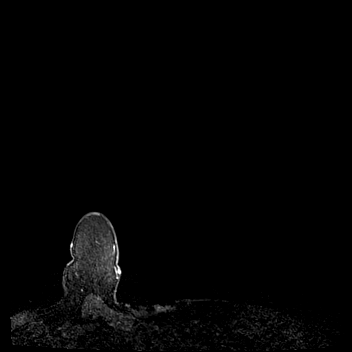

[Series 7: t1_(person_name)3(person_name)_(person_name)_(person_name)_(person_name)_+c · axial · 1.0mm · 0.94mm/px · z∈[-90,+69]mm · 7 of 160 slices shown (3 of 3)]
[im 1/160]
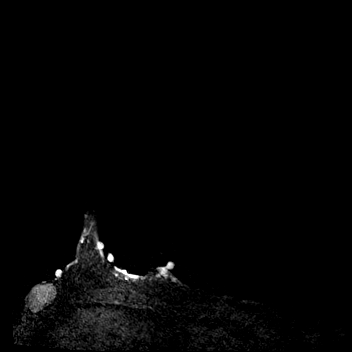
[im 27/160]
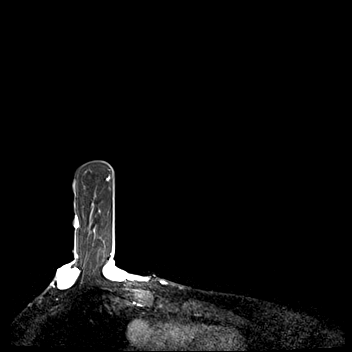
[im 54/160]
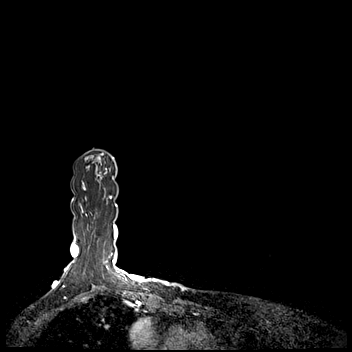
[im 80/160]
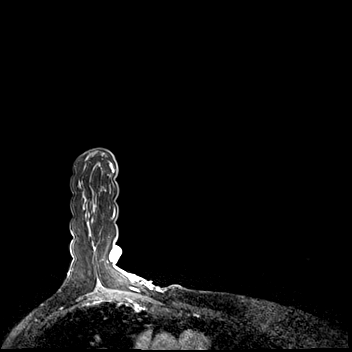
[im 107/160]
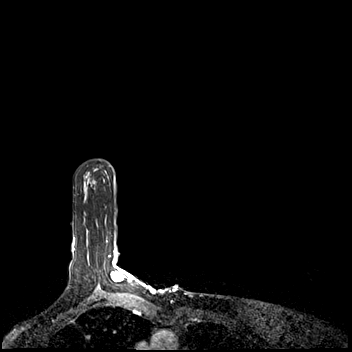
[im 133/160]
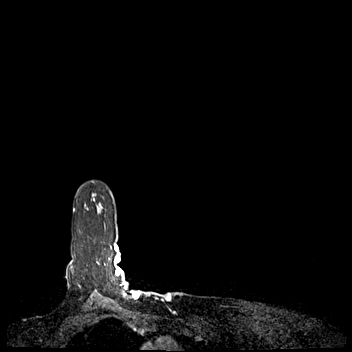
[im 160/160]
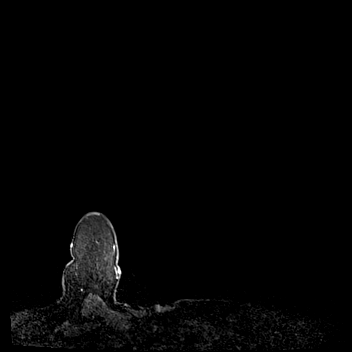

[Series 8: t1_(person_name)3(person_name)_(person_name)_(person_name)_(person_name)_needle_loc · axial · 1.0mm · 0.94mm/px · z∈[-90,+69]mm · 7 of 160 slices shown (1 of 2)]
[im 1/160]
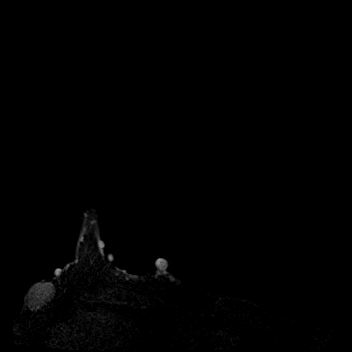
[im 27/160]
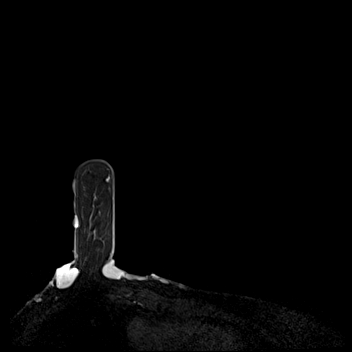
[im 54/160]
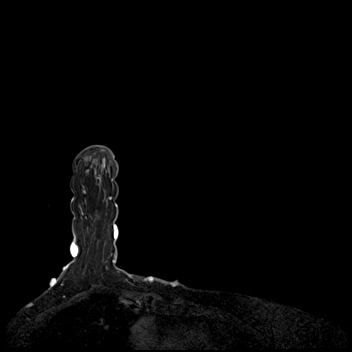
[im 80/160]
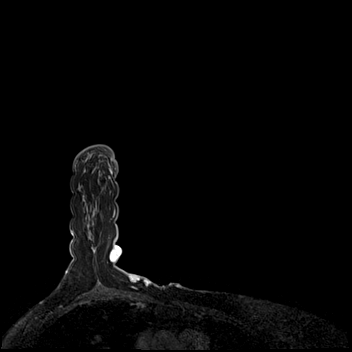
[im 107/160]
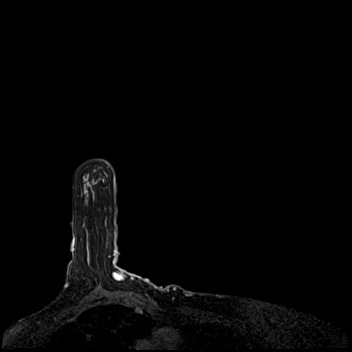
[im 133/160]
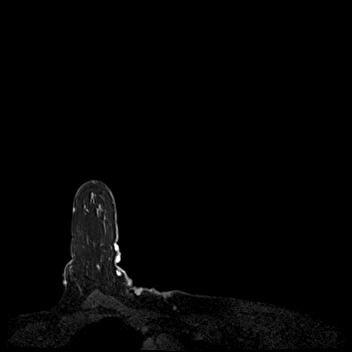
[im 160/160]
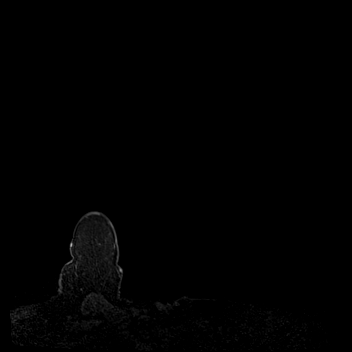

[Series 9: t1_(person_name)3(person_name)_(person_name)_(person_name)_(person_name)_needle_loc · axial · 1.0mm · 0.94mm/px · z∈[-90,-11]mm · 4 of 160 slices shown (2 of 2)]
[im 1/160]
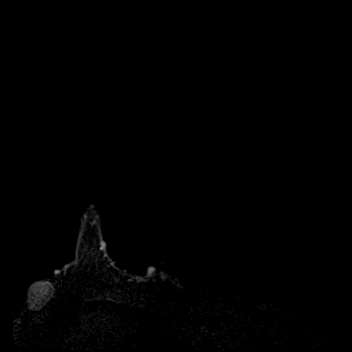
[im 27/160]
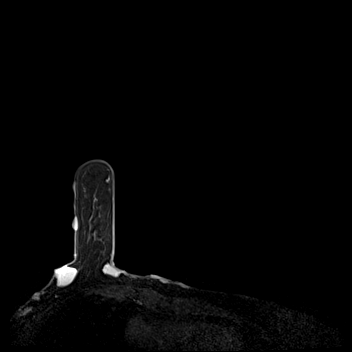
[im 54/160]
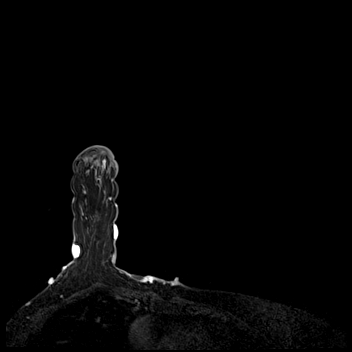
[im 80/160]
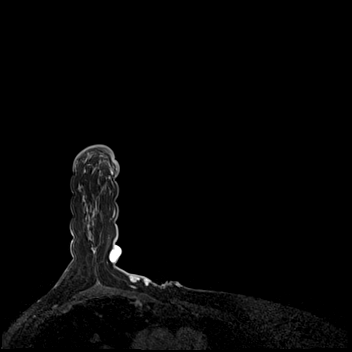

[38 of 48 positions shown; findings below may reference images not displayed]

DESCRIPTION OF PROCEDURE: The patient was positioned prone in the MRI machine and the right breast was placed in mild grid compression. Serial MRI imaging of the breast was performed after the injection of 15 mL Prohance intravenous contrast. The small 0.6 x 0.5 cm enhancing mass in the [DATE] retroareolar region of the right breast was again identified. This was targeted utilizing the CAD stream targeting software and a lateral approach was chosen. The patient's skin was cleaned with Betadine and local anesthesia was administered with 1% lidocaine. Using trocar technique a needle guide was placed and positioning confirmed. Subsequently utilizing a Suros Eviva 9-gauge vacuum-assisted device multiple specimens were obtained. Post sampling images confirmed successful sampling of the targeted area. A HydroMARK butterfly-shaped marker was placed. Postprocedure MRI imaging as well as post procedure digital mammography confirmed successful marker placement at the targeted area. Samples were placed in formalin and sent to the laboratory for pathology evaluation. The patient tolerated the procedure well. Hemostasis was achieved with manual compression and bandage strips were applied.
IMPRESSION: 1.
 Successful ultrasound guided biopsy of a small 0.5 x 0.6 cm enhancing mass at the [DATE] retroareolar region of the right breast.

2.
Pathology results reveal benign findings to include a partially hyalinized intraductal papilloma and fibrocystic changes. No atypia or malignancy seen.

3.
Results were called to the patient on 11/04/2021.
SUMMARY: Benign

Stat fax

## 2021-12-01 IMAGING — OT DXA BONE DENSITY
2 series · 2 of 2 positions shown · non-contrast
Comparison: none

REASON FOR EXAM: Postmenopausal screening

RISK FACTORS:  Low body weight, smoking, emphysema
PRIOR EXAMS:  None
METHOD:  Scans of the spine between L1-L4 and left hip were performed using dual energy X-ray densitometry (DXA)

[Series 1: — · 1 of 1 slices shown (1 of 2)]
[im 1/1]
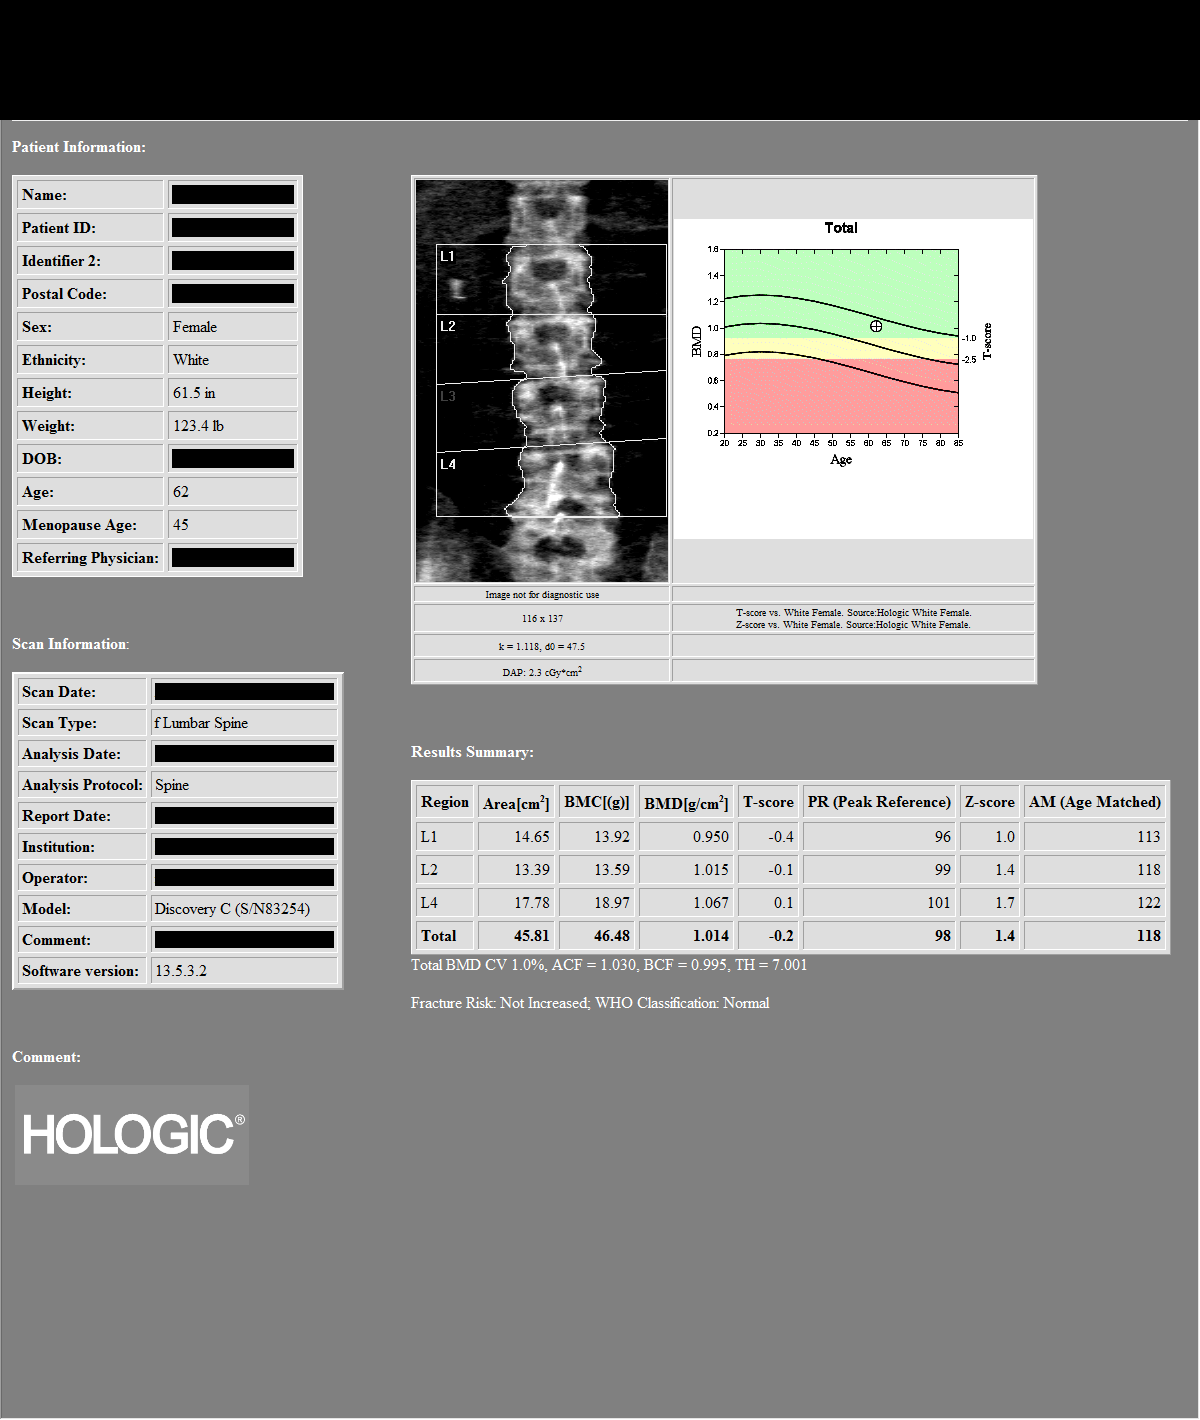

[Series 2: — · left · 1 of 1 slices shown (2 of 2)]
[im 1/1]
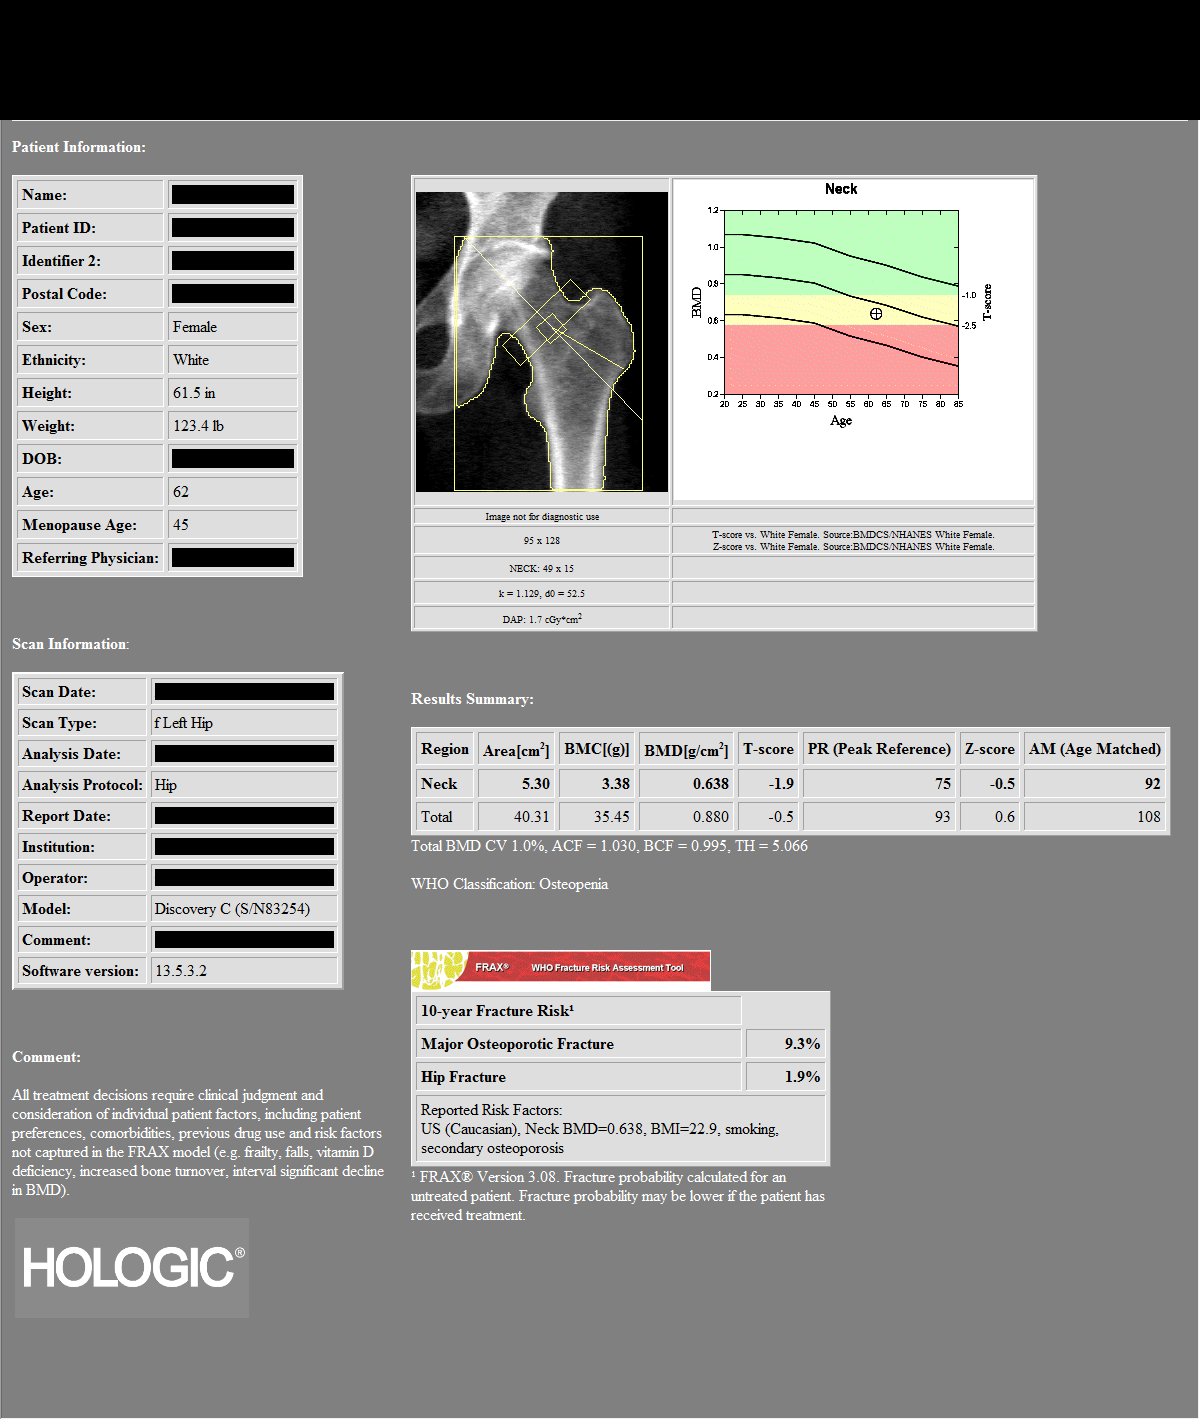

[2 of 2 positions shown; findings below may reference images not displayed]

IMPRESSION: As defined by World Health Organization, the patient meets the criteria for OSTEOPENIA based on left hip T-scores.

PATIENT DEMOGRAPHICS:  61-year-old white  female.
FINDINGS: 1.    Review of scanogram images shows no factor invalidating scan results.  L3 was excluded because of greater than 1 standard deviation difference in its T-score.

2.    The lumbar spine exam using L1-L2, L4 regions shows average Bone Mineral Density is 1.014 gm/cm2 of Hydroxyapatite.  The T-score (comparing patient with a young adult group) is 0.2 standard deviations BELOW mean. The Z-score (comparing patient with an age-matched group) is 1.4 standard deviations ABOVE mean.

3.  The left hip exam using femoral neck region of interest shows average Bone Mineral Density is 0.638 gm/cm2 of Hydroxyapatite. The T-score (comparing patient with a young adult group) is 1.9 standard deviations BELOW mean. The Z-score (comparing patient with an age-matched group) is 0.5 standard deviations BELOW mean.

According to the World Health Organization risk assessment tool (FRAX) for osteopenia only, which uses the femoral neck T-score and includes other patient risk factors for fracture, the patient has a 10-year absolute risk of hip fracture of 1.9% and 10-year absolute risk fracture for any major fracture of 9.3%. Clinicians judgment and/or patient preferences may indicate treatment for people with 10-year fracture probabilities above or below these levels.

RECOMMENDATIONS:  The patient states that she is taking supplements on a regular basis.  The patient should consider quitting smoking and regular exercise to patient tolerance would be of benefit.  The patient is currently not taking prescribed medication for prevention of bone loss.  According to criteria established by the national osteoporosis foundation, the patient DOES NOT meet the current indications for prescribed medical therapy. 

World Health Organization criteria for diagnosis, please see link below.

[URL]

## 2021-12-21 IMAGING — US BRST PRE-OP NDL LOCALIZATION US GUIDED LT
1 series · 4 of 4 positions shown · non-contrast
Comparison: 11/03/2021

INDICATION: Left breast cancer.

[Series 1: brst pre-op ndl localization us guided left · non-contrast · 4 of 4 slices shown]
[im 1/4]
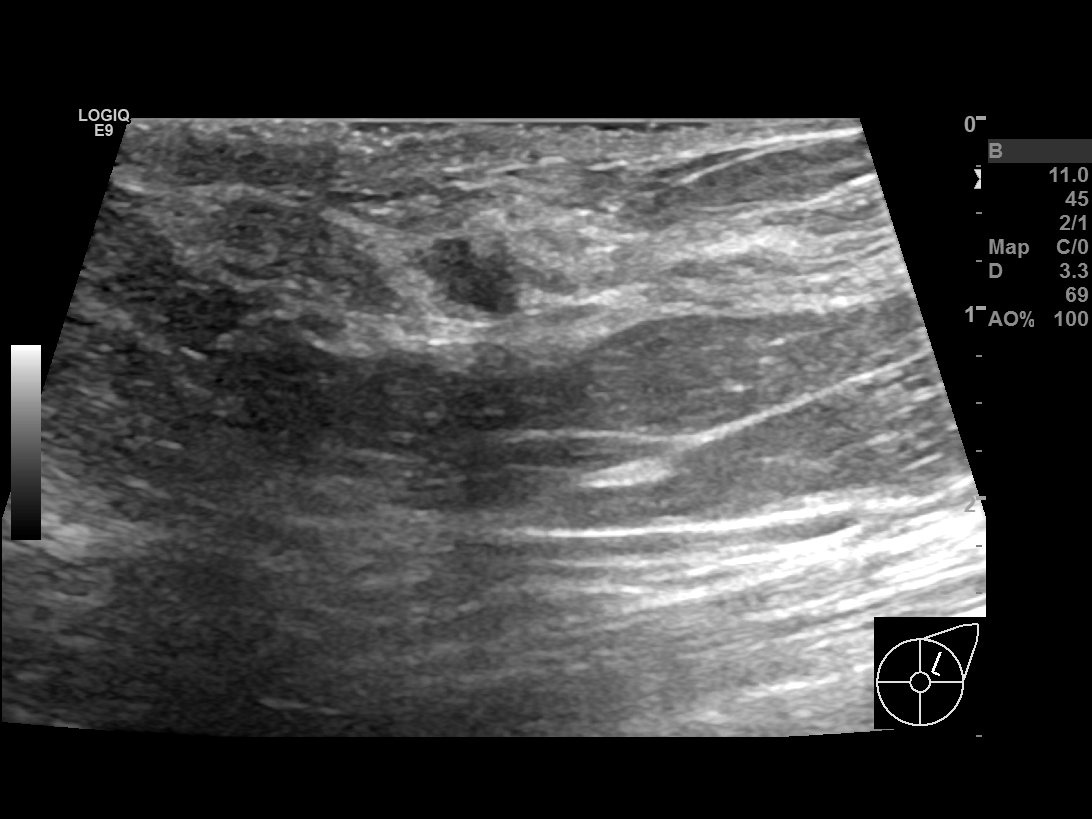
[im 2/4]
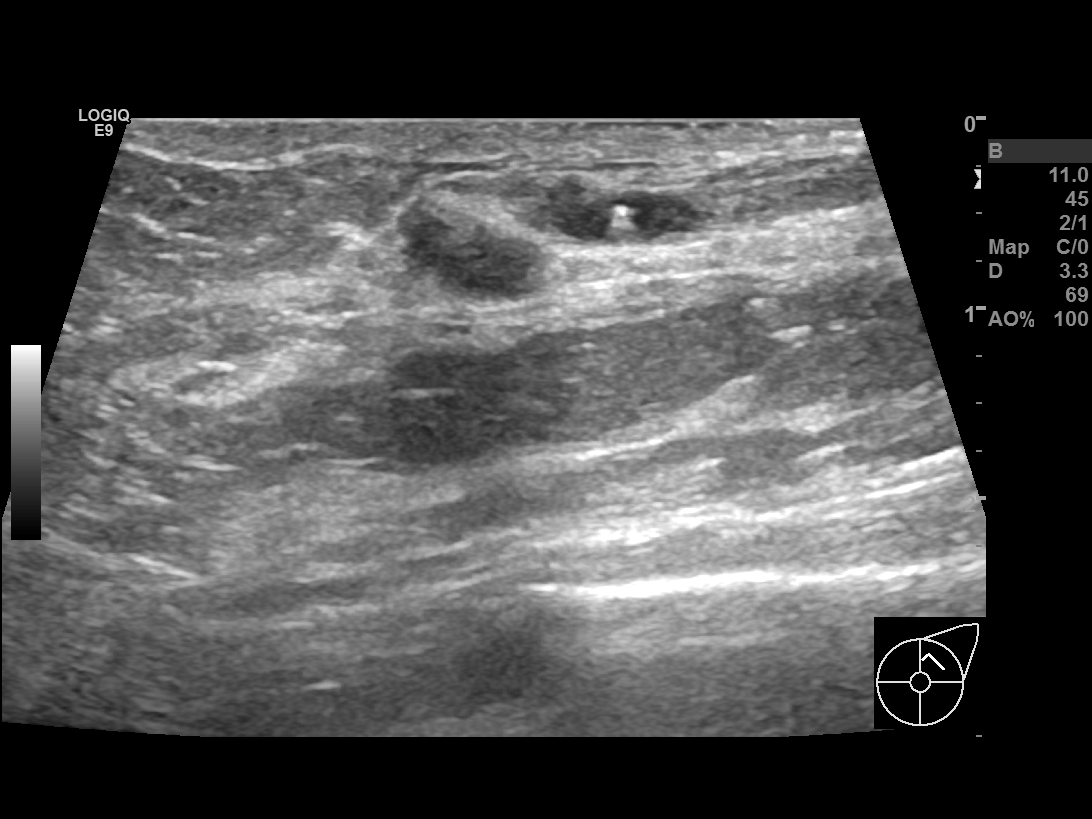
[im 3/4]
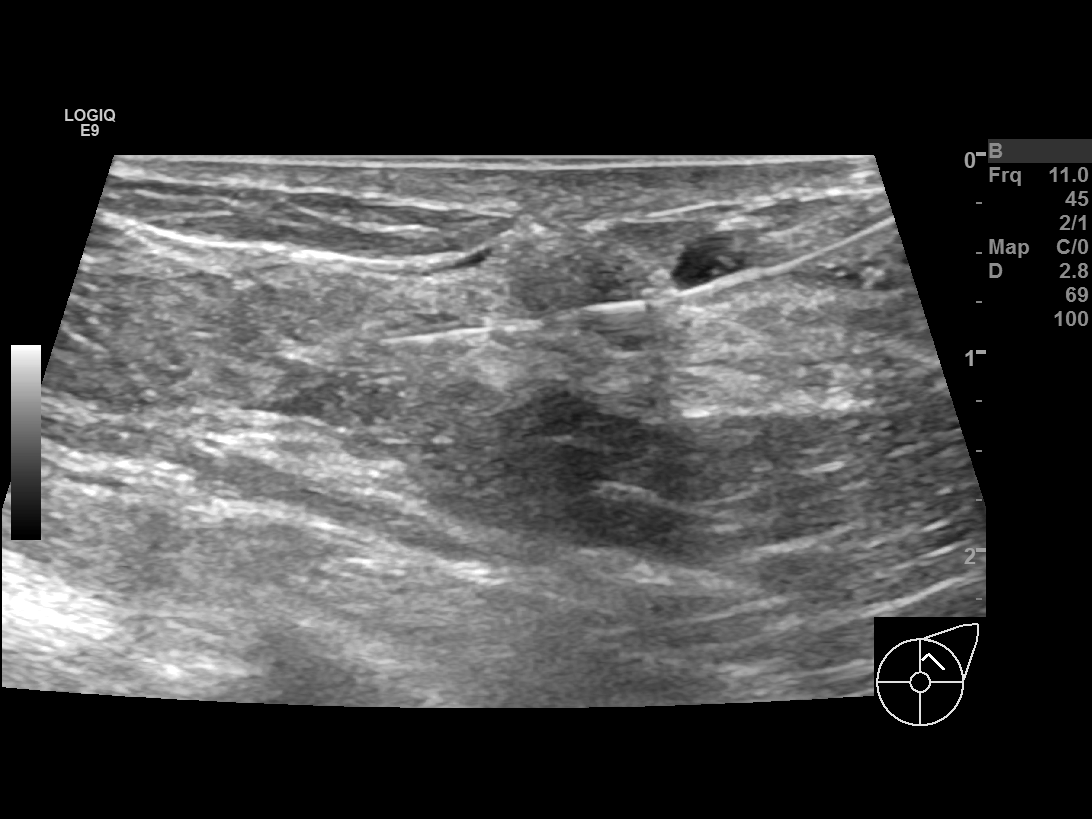
[im 4/4]
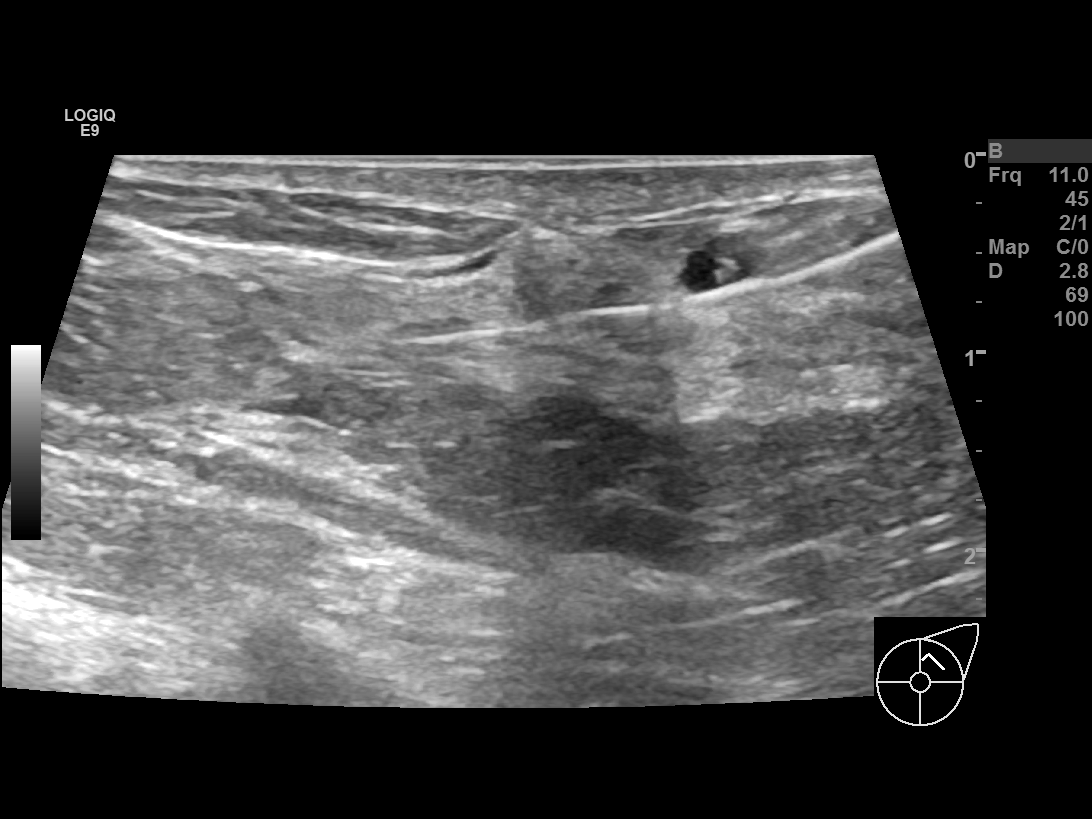

[4 of 4 positions shown; findings below may reference images not displayed]

PROCEDURE:

Informed consent was obtained prior to the procedure.

A ultrasound guided wire localization was performed for the mass in the left breast 1 o'clock 1 cm from the nipple. The skin was prepped in the usual manner.  A J-hook wire was inserted adjacent to the marker.  Post placement digital mammographic imaging was obtained and showed the wire in satisfactory position.

A female technologist was present throughout the procedure.
IMPRESSION: Wire localization for the mass in the left breast 1 o'clock 1 cm from the nipple was successful with no apparent post procedure complications.

## 2021-12-21 IMAGING — MG MAMMO DIAG LT DIGITAL
2 series · 2 of 2 positions shown · non-contrast
Comparison: 11/03/2021

INDICATION: Left breast cancer.

[L LM]
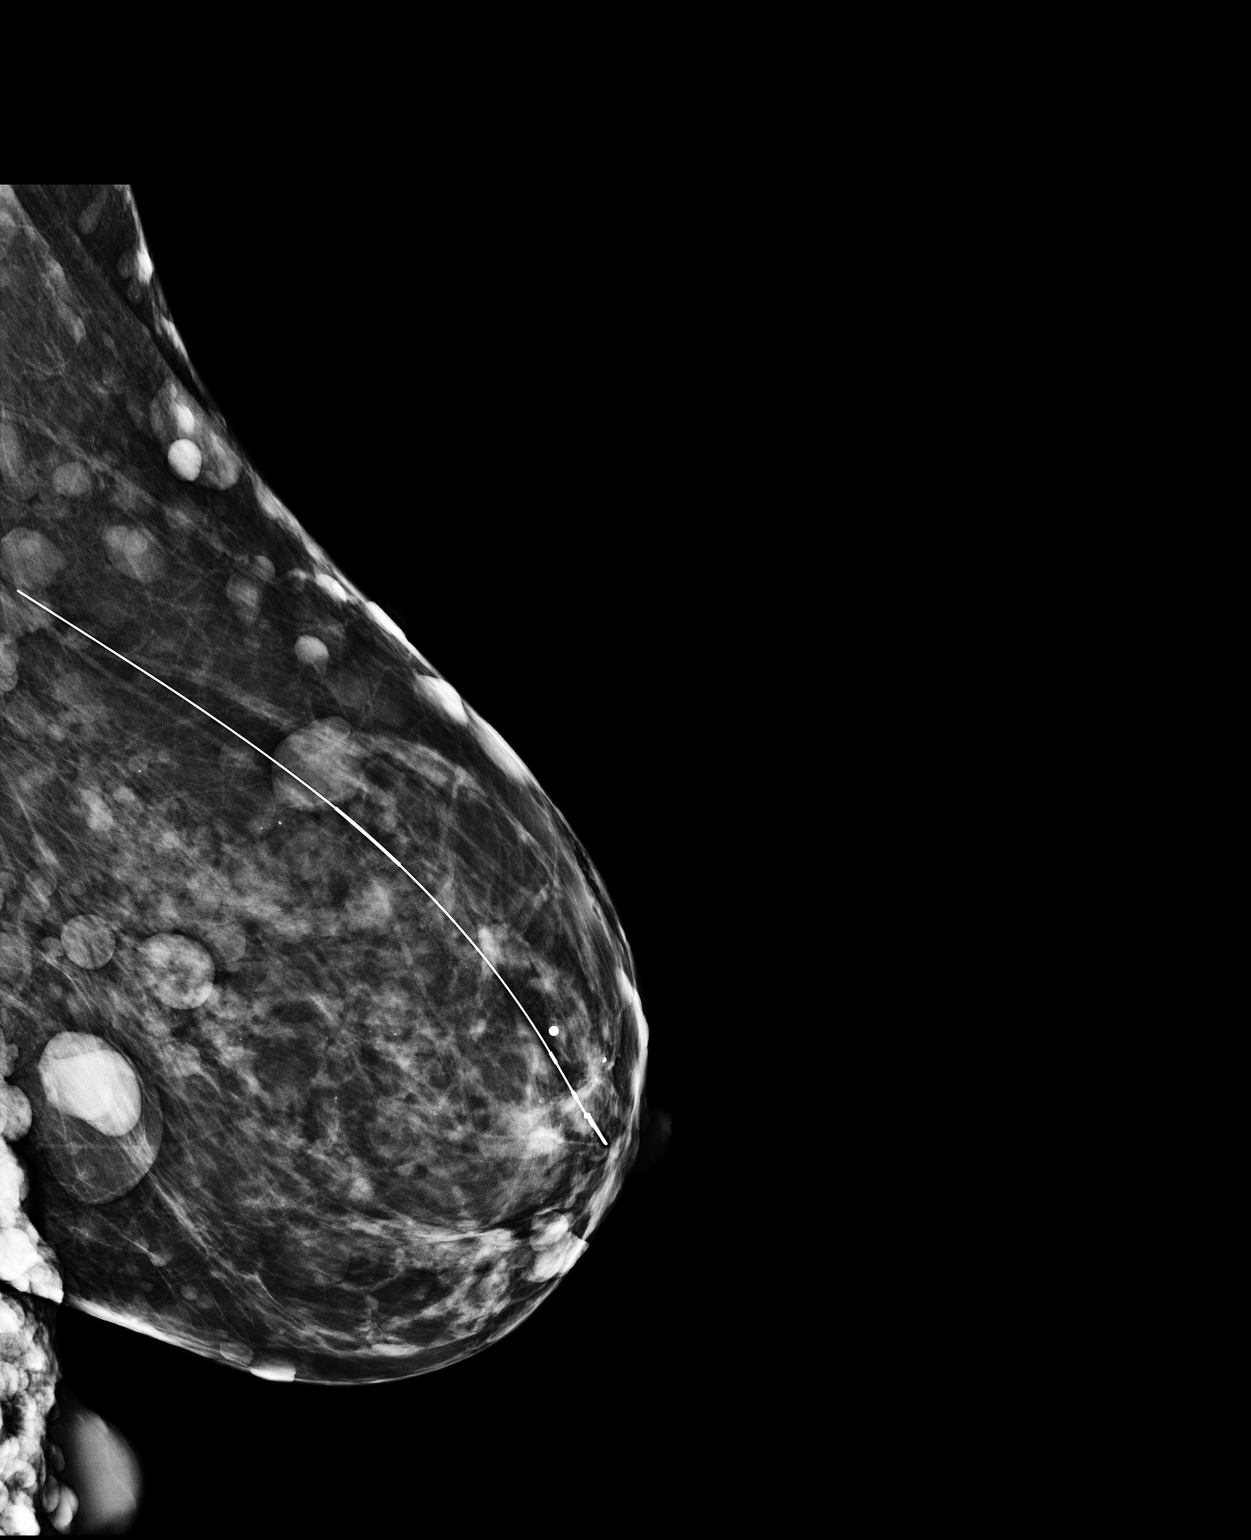

[L CC]
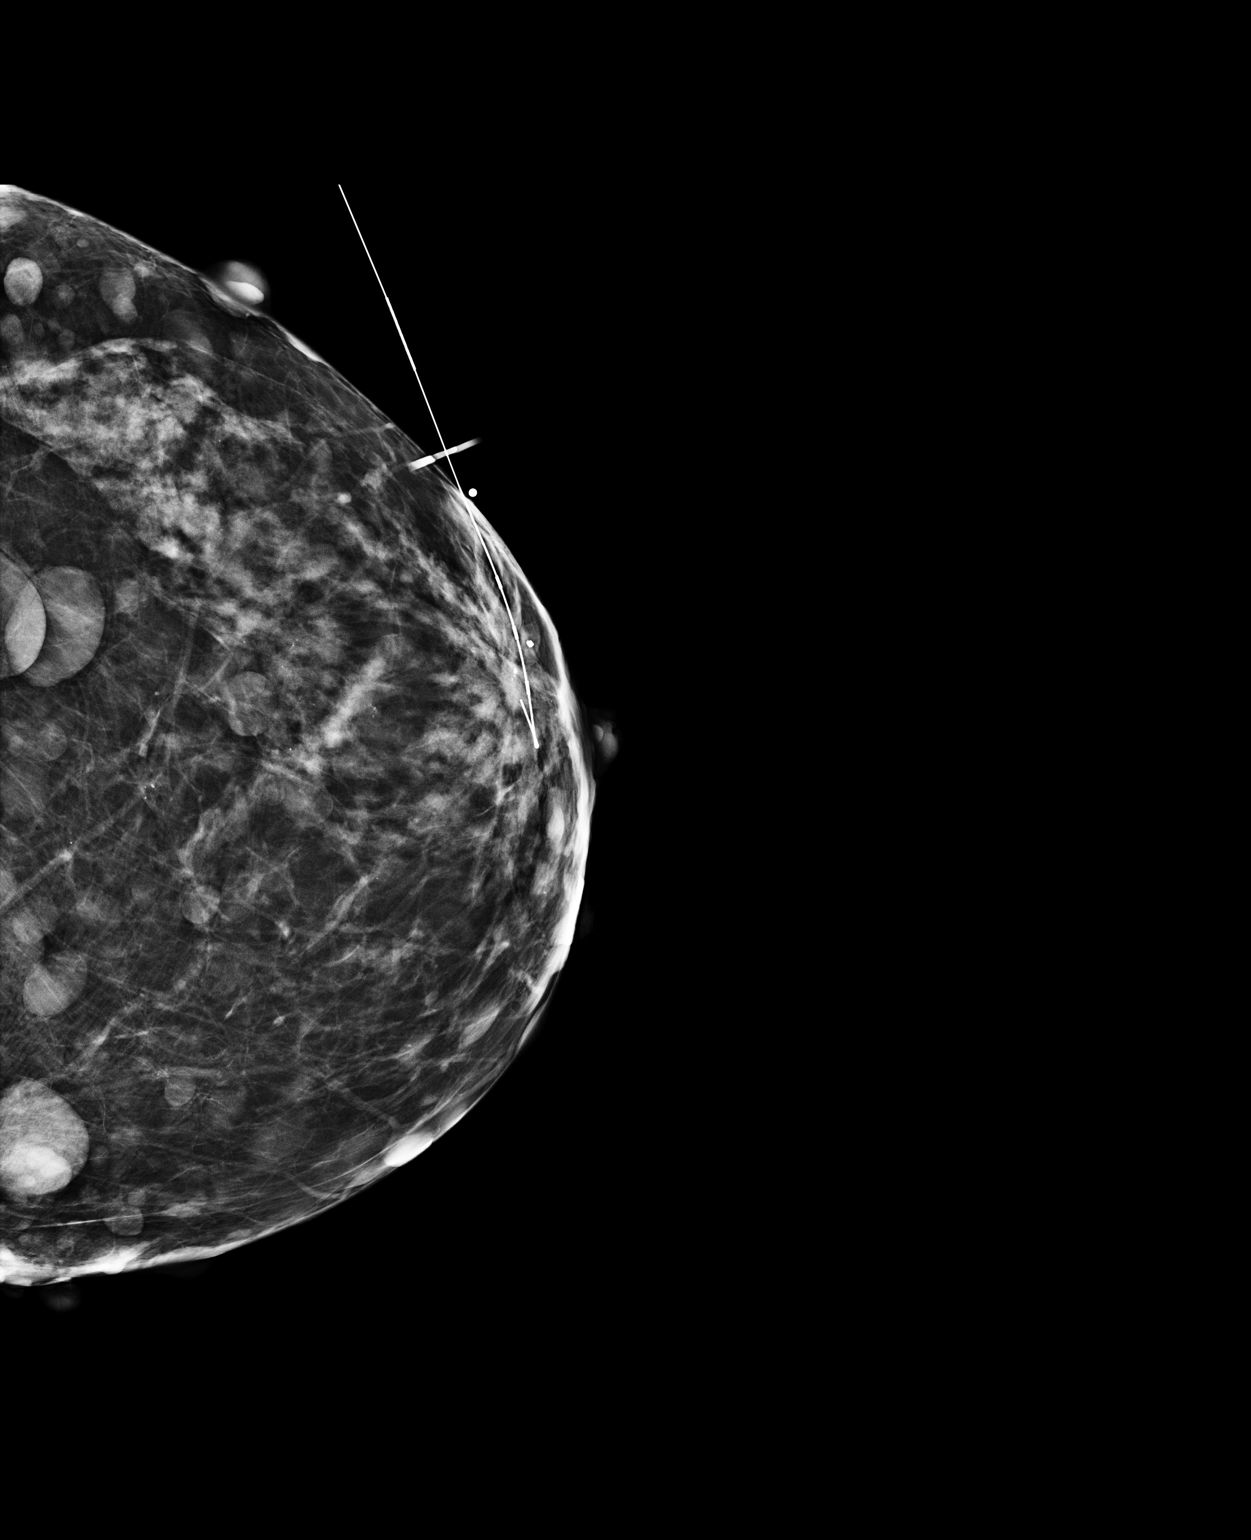

[2 of 2 positions shown; findings below may reference images not displayed]

PROCEDURE:

Informed consent was obtained prior to the procedure.

A ultrasound guided wire localization was performed for the mass in the left breast 1 o'clock 1 cm from the nipple. The skin was prepped in the usual manner.  A J-hook wire was inserted adjacent to the marker.  Post placement digital mammographic imaging was obtained and showed the wire in satisfactory position.

A female technologist was present throughout the procedure.
IMPRESSION: Wire localization for the mass in the left breast 1 o'clock 1 cm from the nipple was successful with no apparent post procedure complications.

## 2022-07-16 IMAGING — CR ABDOMEN  KUB ONLY
1 series · 1 of 1 positions shown · non-contrast
Comparison: None
PROCEDURE: Abdomen 1 view

Images Obtained from [HOSPITAL] Imaging
INDICATION: Calculus in bladder. History of neurofibromatosis.

[t abdomen]
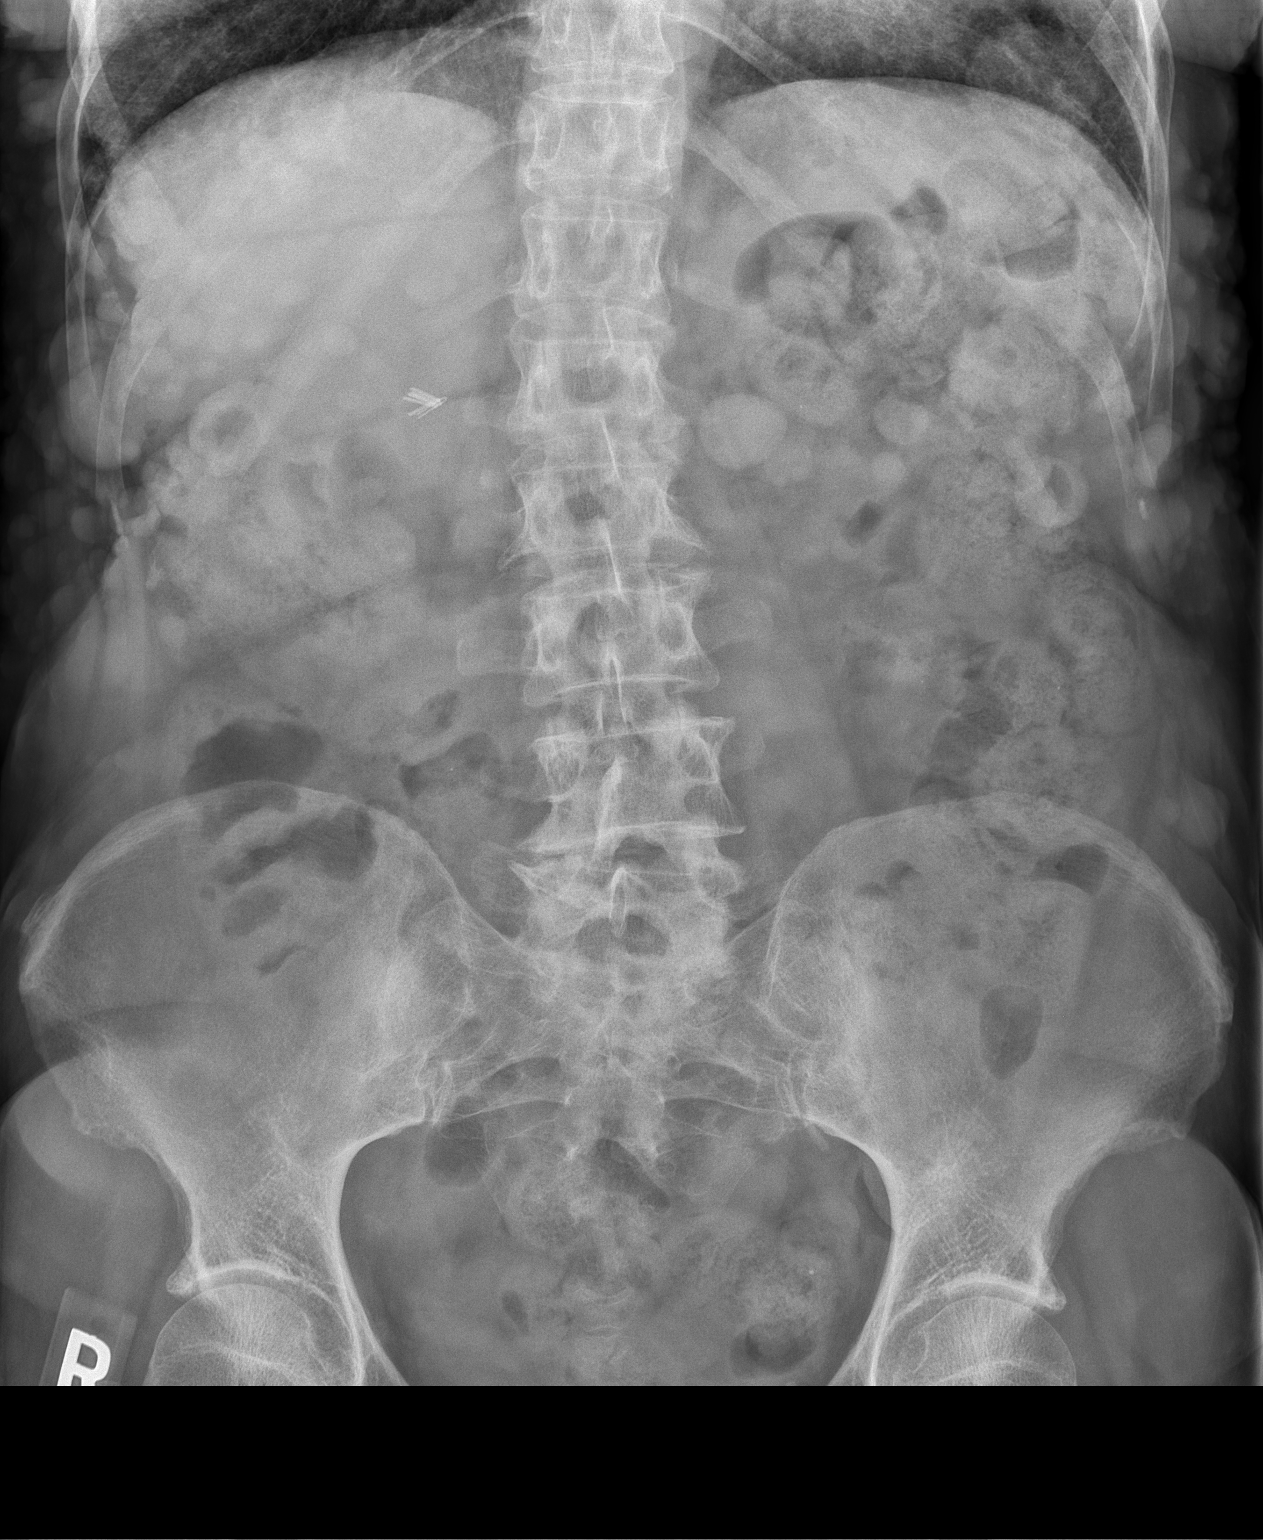

[1 of 1 positions shown; findings below may reference images not displayed]

FINDINGS: The bowel gas pattern is normal. No mass effect, visceromegaly or abnormal calcific density is seen. No bone lesion is seen. Right upper quadrant metallic clips are seen from previous
cholecystectomy. There are a few left pelvic phleboliths. Multiple cutaneous nodules are seen in keeping with the patient's diagnosis of neurofibromatosis. Degenerative changes are seen in the spine
and pelvis.
IMPRESSION: No acute disease. Previous cholecystectomy. Multiple cutaneous nodules in keeping with the patient's diagnosis of neurofibromatosis.

## 2022-09-26 ENCOUNTER — Encounter (INDEPENDENT_AMBULATORY_CARE_PROVIDER_SITE_OTHER): Payer: Self-pay

## 2023-02-15 IMAGING — CR CHEST 2 VWS PA LAT
1 series · 2 of 2 positions shown · non-contrast
Comparison: 06/13/19

Images Obtained from [facility]
HISTORY: 62 years old Female with Estrogen receptor positive status [ER+], personal history of malignant neoplasm of breast
TECHNIQUE: 2 view radiograph of the chest.

[Series 1: PA · 0.17mm/px · 2 of 2 slices shown]
[im 1/2]
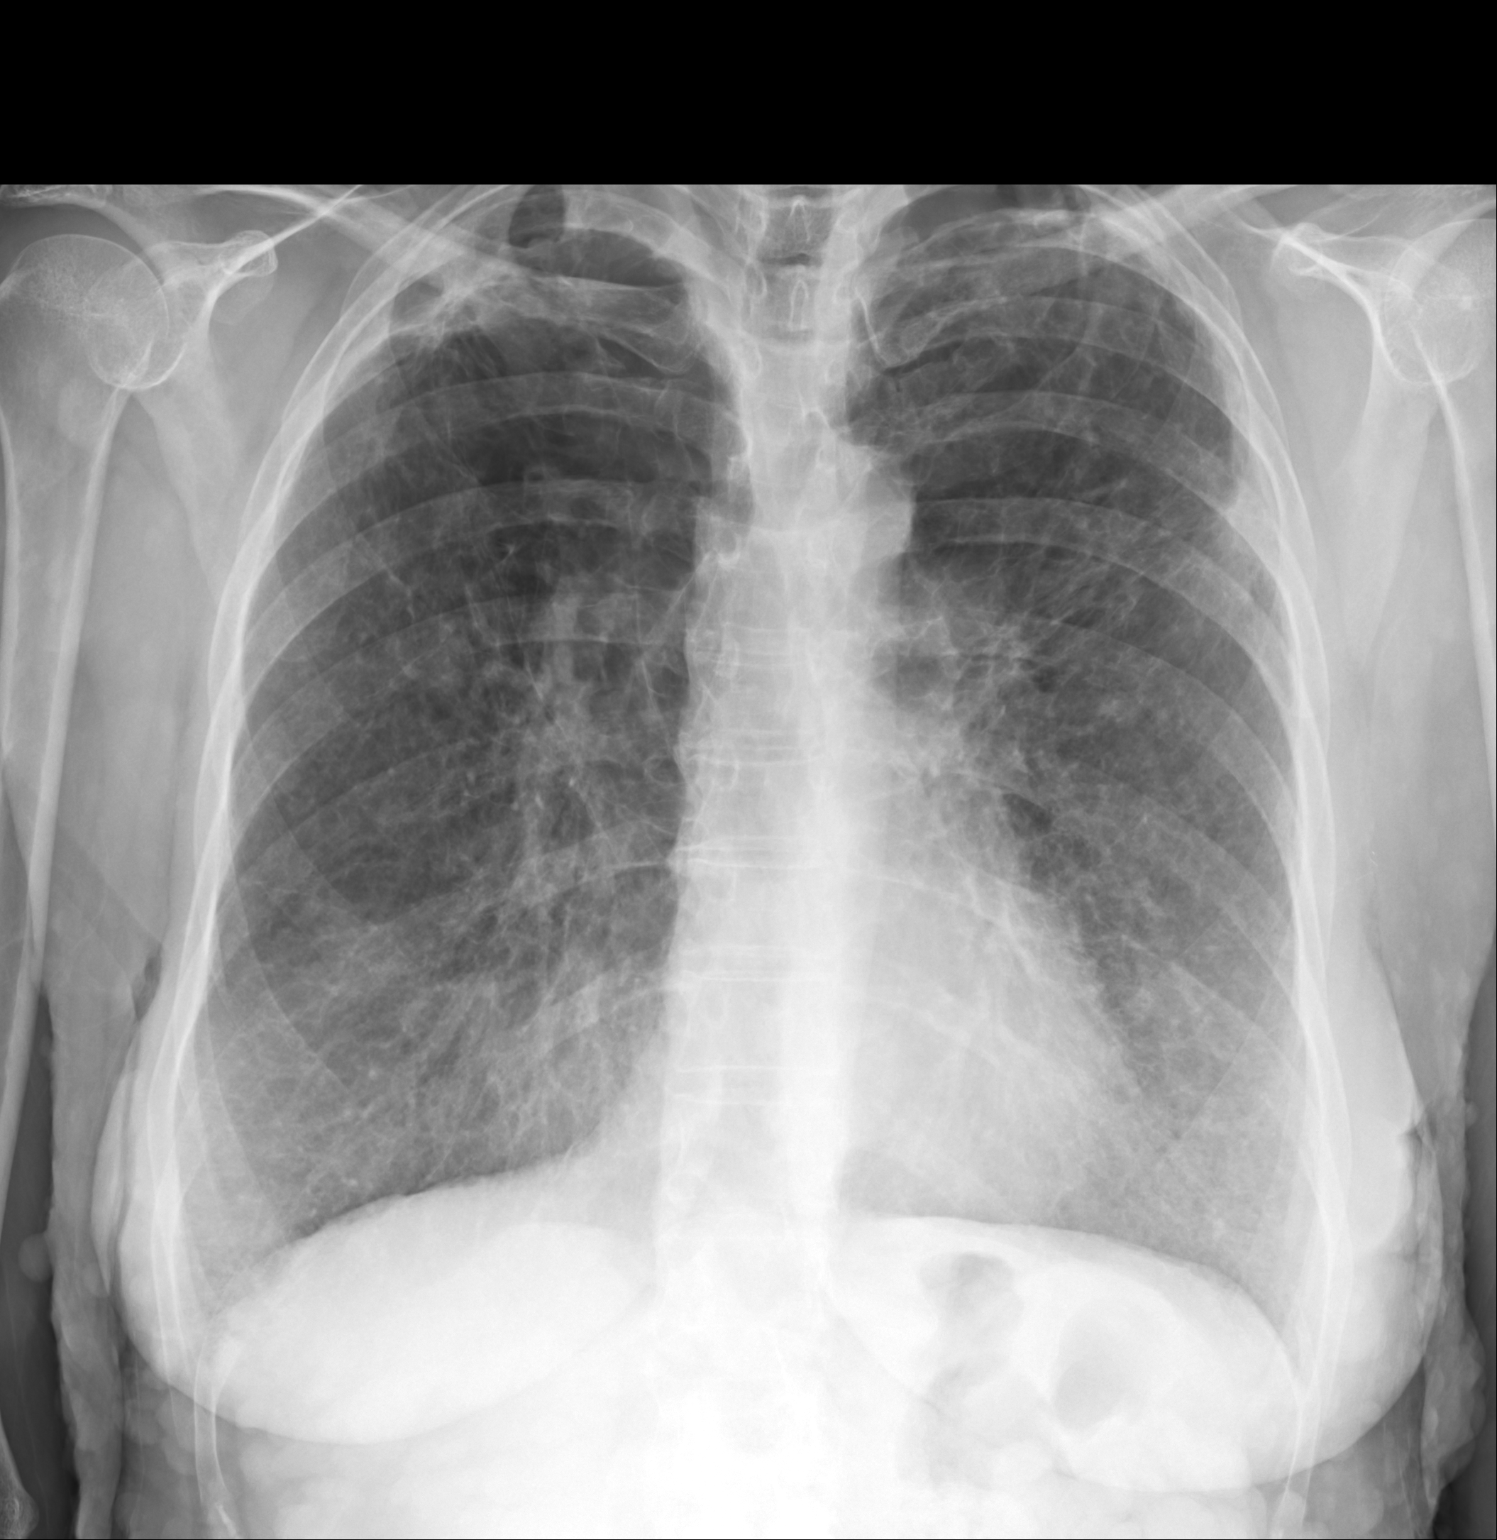
[im 2/2]
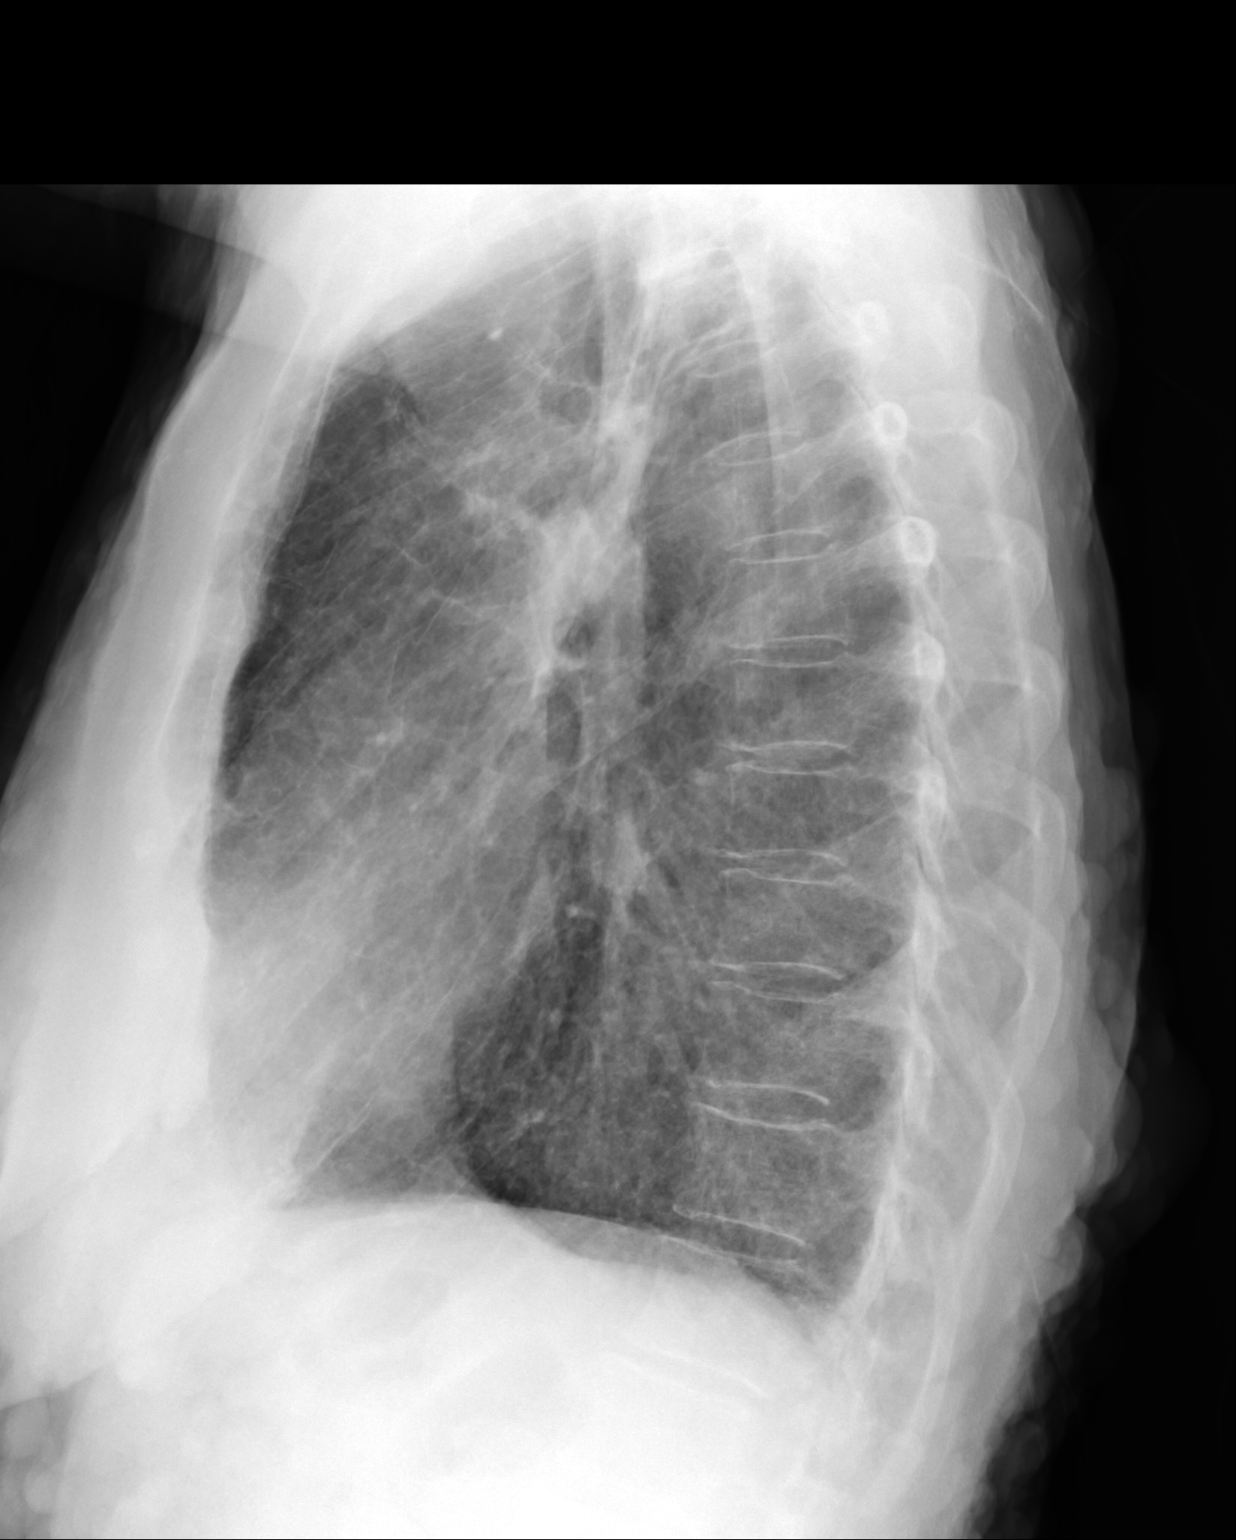

[2 of 2 positions shown; findings below may reference images not displayed]

FINDINGS: Diffuse moderate to severe chronic interstitial change with inter and intralobar septal thickening's, groundglass component, as well as biapical pleural-parenchymal disease.
Heart size normal. No effusion. No alveolar consolidation. Multiple skin nodules consistent with neurofibromatosis.
IMPRESSION: Moderate to severe chronic interstitial change.

## 2023-12-27 ENCOUNTER — Encounter (INDEPENDENT_AMBULATORY_CARE_PROVIDER_SITE_OTHER): Payer: Self-pay
